# Patient Record
Sex: Female | Born: 1985
Health system: Southern US, Community
[De-identification: ages and names within clinical notes are randomized; demographics above are authoritative.]

## PROBLEM LIST (undated history)

## (undated) DIAGNOSIS — Z8619 Personal history of other infectious and parasitic diseases: Secondary | ICD-10-CM

## (undated) DIAGNOSIS — Z789 Other specified health status: Secondary | ICD-10-CM

## (undated) DIAGNOSIS — F32A Depression, unspecified: Secondary | ICD-10-CM

## (undated) DIAGNOSIS — F419 Anxiety disorder, unspecified: Secondary | ICD-10-CM

## (undated) DIAGNOSIS — B999 Unspecified infectious disease: Secondary | ICD-10-CM

## (undated) DIAGNOSIS — F329 Major depressive disorder, single episode, unspecified: Secondary | ICD-10-CM

## (undated) HISTORY — PX: NO PAST SURGERIES: SHX2092

## (undated) HISTORY — PX: WISDOM TOOTH EXTRACTION: SHX21

## (undated) HISTORY — DX: Personal history of other infectious and parasitic diseases: Z86.19

---
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
## 2010-02-11 ENCOUNTER — Ambulatory Visit (HOSPITAL_COMMUNITY)
Admission: RE | Admit: 2010-02-11 | Discharge: 2010-02-11 | Payer: Self-pay | Source: Home / Self Care | Attending: Obstetrics and Gynecology | Admitting: Obstetrics and Gynecology

## 2010-02-16 ENCOUNTER — Ambulatory Visit (HOSPITAL_COMMUNITY)
Admission: RE | Admit: 2010-02-16 | Discharge: 2010-02-16 | Payer: Self-pay | Source: Home / Self Care | Attending: Obstetrics and Gynecology | Admitting: Obstetrics and Gynecology

## 2010-03-01 ENCOUNTER — Ambulatory Visit (HOSPITAL_COMMUNITY)
Admission: RE | Admit: 2010-03-01 | Discharge: 2010-03-01 | Payer: Self-pay | Source: Home / Self Care | Attending: Obstetrics and Gynecology | Admitting: Obstetrics and Gynecology

## 2010-03-10 ENCOUNTER — Ambulatory Visit (HOSPITAL_COMMUNITY): Admission: RE | Admit: 2010-03-10 | Payer: Self-pay | Source: Home / Self Care | Admitting: Obstetrics and Gynecology

## 2010-03-11 ENCOUNTER — Ambulatory Visit (HOSPITAL_COMMUNITY)
Admission: RE | Admit: 2010-03-11 | Discharge: 2010-03-11 | Payer: Self-pay | Source: Home / Self Care | Attending: Obstetrics and Gynecology | Admitting: Obstetrics and Gynecology

## 2010-03-12 ENCOUNTER — Other Ambulatory Visit (HOSPITAL_COMMUNITY): Payer: Self-pay | Admitting: Obstetrics and Gynecology

## 2010-03-18 ENCOUNTER — Other Ambulatory Visit (HOSPITAL_COMMUNITY): Payer: Self-pay | Admitting: Obstetrics and Gynecology

## 2010-03-18 ENCOUNTER — Ambulatory Visit (HOSPITAL_COMMUNITY)
Admission: RE | Admit: 2010-03-18 | Discharge: 2010-03-18 | Payer: Self-pay | Source: Home / Self Care | Attending: Obstetrics and Gynecology | Admitting: Obstetrics and Gynecology

## 2010-03-18 DIAGNOSIS — R609 Edema, unspecified: Secondary | ICD-10-CM

## 2010-03-21 ENCOUNTER — Ambulatory Visit (HOSPITAL_COMMUNITY)
Admission: RE | Admit: 2010-03-21 | Discharge: 2010-03-21 | Payer: Self-pay | Source: Home / Self Care | Attending: Obstetrics and Gynecology | Admitting: Obstetrics and Gynecology

## 2010-03-25 ENCOUNTER — Other Ambulatory Visit (HOSPITAL_COMMUNITY): Payer: Self-pay | Admitting: Obstetrics and Gynecology

## 2010-03-25 ENCOUNTER — Other Ambulatory Visit (HOSPITAL_COMMUNITY): Payer: Self-pay

## 2010-03-25 ENCOUNTER — Ambulatory Visit (HOSPITAL_COMMUNITY)
Admission: RE | Admit: 2010-03-25 | Discharge: 2010-03-25 | Disposition: A | Discharge status: Home / Self Care | Payer: BC Managed Care – PPO | Source: Ambulatory Visit | Attending: Obstetrics and Gynecology | Admitting: Obstetrics and Gynecology

## 2010-03-25 DIAGNOSIS — O358XX0 Maternal care for other (suspected) fetal abnormality and damage, not applicable or unspecified: Secondary | ICD-10-CM | POA: Insufficient documentation

## 2010-03-25 DIAGNOSIS — O337XX0 Maternal care for disproportion due to other fetal deformities, not applicable or unspecified: Secondary | ICD-10-CM | POA: Insufficient documentation

## 2010-03-25 DIAGNOSIS — Z3689 Encounter for other specified antenatal screening: Secondary | ICD-10-CM | POA: Insufficient documentation

## 2010-03-26 ENCOUNTER — Inpatient Hospital Stay (HOSPITAL_COMMUNITY)
Admission: AD | Admit: 2010-03-26 | Discharge: 2010-03-28 | DRG: 380 | Disposition: A | Discharge status: Home / Self Care | Payer: BC Managed Care – PPO | Source: Ambulatory Visit | Attending: Obstetrics and Gynecology | Admitting: Obstetrics and Gynecology

## 2010-03-26 ENCOUNTER — Encounter (HOSPITAL_COMMUNITY): Payer: Self-pay | Admitting: Obstetrics and Gynecology

## 2010-03-26 DIAGNOSIS — O021 Missed abortion: Principal | ICD-10-CM | POA: Diagnosis present

## 2010-03-27 ENCOUNTER — Other Ambulatory Visit: Payer: Self-pay | Admitting: Obstetrics and Gynecology

## 2010-03-27 LAB — TYPE AND SCREEN
ABO/RH(D): O POS
Antibody Screen: NEGATIVE

## 2010-03-28 LAB — CBC
MCV: 92.1 fL (ref 78.0–100.0)
Platelets: 72 10*3/uL — ABNORMAL LOW (ref 150–400)
RDW: 12.6 % (ref 11.5–15.5)
WBC: 9.3 10*3/uL (ref 4.0–10.5)

## 2010-03-31 ENCOUNTER — Ambulatory Visit (HOSPITAL_COMMUNITY): Payer: Self-pay

## 2010-04-13 NOTE — H&P (Signed)
  NAMETEIGAN, MANNER NO.:  [id number]  MEDICAL RECORD NO.:  [id number]         PATIENT TYPE:  WINP  LOCATION:  372                           FACILITY:  [facility]  PHYSICIAN:  Juluis Mire, M.D.   DATE OF BIRTH:  [birth year]  DATE OF ADMISSION:  03/26/2010 DATE OF DISCHARGE:                             HISTORY & PHYSICAL   The patient is a 25 year old gravida 1, para 0 female, estimated date confinement, September 18, 2009, given her estimated gestational age of [redacted] weeks.  Her prenatal course had been complicated.  Her first trimester ultrasound had revealed the evidence of the cystic hygroma.  She was referred to Maternal Fetal Medicine for followup ultrasound, it did reveal extensive hydropic changes with the cystic hygroma, poor effusions, ascites, and pericardial effusions.  There was obviously much concern about the possibility of genetic issues.  She was seen last week by Maternal Fetal Medicine, ultrasound revealed a nonviable pregnancy at that point in time.  She now presents for Cytotec induction.  In terms of allergies, the patient has no known drug allergies.  Medications include prenatal vitamins.  PAST MEDICAL HISTORY:  She has usual childhood diseases.  Does have a history of migraine headaches.  PAST SURGICAL HISTORY:  She had a wisdom tooth extraction, no other surgery noted.  FAMILY HISTORY:  Noncontributory.  SOCIAL HISTORY:  No tobacco or alcohol use.  REVIEW OF SYSTEMS:  Noncontributory.  PHYSICAL EXAMINATION:  GENERAL/VITAL SIGNS:  The patient is afebrile with stable vital signs. LUNGS:  Clear. CARDIOVASCULAR SYSTEM:  Regular rate without murmurs or gallops. ABDOMEN:  Benign.  Gravid uterus noted below the umbilicus. PELVIC:  Cervix long and closed. EXTREMITIES:  Trace edema. NEUROLOGIC:  Grossly normal limits.  IMPRESSION: 1. Nonviable pregnancy at 18 weeks. 2. Previous findings of extensive fetal anomalies probably secondary  to genetic issues.  PLAN:  The patient will undergo Cytotec induction.  The nature of procedure and risks have been explained.  Her blood type is O positive. RhoGAM will not be required.  There will be the potential need for D and C.     Juluis Mire, M.D.     JSM/MEDQ  D:  03/27/2010  T:  03/27/2010  Job:  [id number]  Electronically Signed by Richardean Chimera M.D. on 04/13/2010 07:46:08 AM

## 2010-04-13 NOTE — Discharge Summary (Signed)
  Janet Martinez, Janet Martinez               ACCOUNT NO.:  [id number]  MEDICAL RECORD NO.:  [id number]           PATIENT TYPE:  LOCATION:                                 FACILITY:  PHYSICIAN:  Juluis Mire, M.D.   DATE OF BIRTH:  1985/12/07  DATE OF ADMISSION:  03/26/2010 DATE OF DISCHARGE:                              DISCHARGE SUMMARY   ADMITTING DIAGNOSES:  Intrauterine pregnancy at 18 weeks with intrauterine fetal demise.  Evidence of significant fetal anomalies with cystic hygroma, probably genetic disorders.  DISCHARGE DIAGNOSES:  Intrauterine pregnancy at 18 weeks with intrauterine fetal demise.  Evidence of significant fetal anomalies with cystic hygroma, probably genetic disorders.  OPERATIVE PROCEDURE:  Cytotec induced delivery.  For complete history and physical please see dictated note.  COURSE IN THE HOSPITAL:  The patient is begun on Cytotec.  Friday afternoon delivered the fetus intact.  There is gross anomalies with the fetal head.  It was a female infant, will be sent for further evaluation. Placenta was delivered intact.  Tissue is sent for genetics.  She was doing well.  We decided to discharge her home on the Sunday afternoon. She will be discharged home in stable condition.  In terms of complications, none were encountered in the hospital, the patient is discharged home in stable condition.  DISPOSITION:  The patient to avoid vaginal entrance.  Will call with heavy bleeding, fever, excessive pain, nausea, vomiting.  Discharged home on Tylox as needed for pain and a short course of ciprofloxacin. Office will call her tomorrow and arrange followup.     Juluis Mire, M.D.     JSM/MEDQ  D:  03/27/2010  T:  03/28/2010  Job:  [id number]  Electronically Signed by Richardean Chimera M.D. on 04/13/2010 07:46:06 AM

## 2010-04-21 DEATH — deceased

## 2010-05-18 ENCOUNTER — Ambulatory Visit (HOSPITAL_COMMUNITY): Admission: RE | Admit: 2010-05-18 | Payer: BC Managed Care – PPO | Source: Ambulatory Visit

## 2010-05-18 ENCOUNTER — Ambulatory Visit (HOSPITAL_COMMUNITY)
Admission: RE | Admit: 2010-05-18 | Discharge: 2010-05-18 | Disposition: A | Discharge status: Home / Self Care | Payer: BC Managed Care – PPO | Source: Ambulatory Visit | Attending: Obstetrics and Gynecology | Admitting: Obstetrics and Gynecology

## 2010-07-15 LAB — RPR
RPR: NONREACTIVE
RPR: NONREACTIVE

## 2010-07-15 LAB — GC/CHLAMYDIA PROBE AMP, GENITAL
Chlamydia: NEGATIVE
Chlamydia: NEGATIVE
Gonorrhea: NEGATIVE

## 2010-07-15 LAB — HIV ANTIBODY (ROUTINE TESTING W REFLEX): HIV: NONREACTIVE

## 2011-02-07 ENCOUNTER — Telehealth (HOSPITAL_COMMUNITY): Payer: Self-pay | Admitting: *Deleted

## 2011-02-07 ENCOUNTER — Encounter (HOSPITAL_COMMUNITY): Payer: Self-pay | Admitting: *Deleted

## 2011-02-07 NOTE — Telephone Encounter (Signed)
Preadmission screen  

## 2011-02-08 ENCOUNTER — Inpatient Hospital Stay (HOSPITAL_COMMUNITY)
Admission: RE | Admit: 2011-02-08 | Discharge: 2011-02-10 | DRG: 373 | Disposition: A | Discharge status: Home / Self Care | Payer: BC Managed Care – PPO | Source: Ambulatory Visit | Attending: Obstetrics and Gynecology | Admitting: Obstetrics and Gynecology

## 2011-02-08 ENCOUNTER — Inpatient Hospital Stay (HOSPITAL_COMMUNITY): Payer: BC Managed Care – PPO | Admitting: Anesthesiology

## 2011-02-08 ENCOUNTER — Encounter (HOSPITAL_COMMUNITY): Payer: Self-pay | Admitting: Anesthesiology

## 2011-02-08 ENCOUNTER — Encounter (HOSPITAL_COMMUNITY): Payer: Self-pay

## 2011-02-08 LAB — CBC
HCT: 41.2 % (ref 36.0–46.0)
Hemoglobin: 14.3 g/dL (ref 12.0–15.0)
WBC: 14.6 10*3/uL — ABNORMAL HIGH (ref 4.0–10.5)

## 2011-02-08 MED ORDER — LIDOCAINE HCL (PF) 1 % IJ SOLN: 30.0000 mL | INTRAMUSCULAR | Status: DC | PRN

## 2011-02-08 MED ORDER — LACTATED RINGERS IV SOLN
INTRAVENOUS | Status: DC
  Administered 2011-02-08 (×2): via INTRAVENOUS

## 2011-02-08 MED ORDER — LACTATED RINGERS IV SOLN
500.0000 mL | Freq: Once | INTRAVENOUS | Status: AC
  Administered 2011-02-08: 1000 mL via INTRAVENOUS

## 2011-02-08 MED ORDER — LIDOCAINE HCL 1.5 % IJ SOLN
INTRAMUSCULAR | Status: DC | PRN
  Administered 2011-02-08 (×2): 5 mL via INTRADERMAL
  Administered 2011-02-08: 2 mL via INTRADERMAL

## 2011-02-08 MED ORDER — MEDROXYPROGESTERONE ACETATE 150 MG/ML IM SUSP: 150.0000 mg | INTRAMUSCULAR | Status: DC | PRN

## 2011-02-08 MED ORDER — OXYTOCIN 20 UNITS IN LACTATED RINGERS INFUSION - SIMPLE: 125.0000 mL/h | Freq: Once | INTRAVENOUS | Status: AC

## 2011-02-08 MED ORDER — METHYLERGONOVINE MALEATE 0.2 MG PO TABS: 0.2000 mg | ORAL_TABLET | ORAL | Status: DC | PRN

## 2011-02-08 MED ORDER — PHENYLEPHRINE 40 MCG/ML (10ML) SYRINGE FOR IV PUSH (FOR BLOOD PRESSURE SUPPORT)
80.0000 ug | PREFILLED_SYRINGE | INTRAVENOUS | Status: DC | PRN
  Filled 2011-02-08: qty 5

## 2011-02-08 MED ORDER — ONDANSETRON HCL 4 MG PO TABS: 4.0000 mg | ORAL_TABLET | ORAL | Status: DC | PRN

## 2011-02-08 MED ORDER — SIMETHICONE 80 MG PO CHEW: 80.0000 mg | CHEWABLE_TABLET | ORAL | Status: DC | PRN

## 2011-02-08 MED ORDER — EPHEDRINE 5 MG/ML INJ
10.0000 mg | INTRAVENOUS | Status: AC | PRN
  Administered 2011-02-08 (×2): 10 mg via INTRAVENOUS
  Filled 2011-02-08: qty 4

## 2011-02-08 MED ORDER — PHENYLEPHRINE 40 MCG/ML (10ML) SYRINGE FOR IV PUSH (FOR BLOOD PRESSURE SUPPORT): 80.0000 ug | PREFILLED_SYRINGE | INTRAVENOUS | Status: DC | PRN

## 2011-02-08 MED ORDER — ONDANSETRON HCL 4 MG/2ML IJ SOLN: 4.0000 mg | INTRAMUSCULAR | Status: DC | PRN

## 2011-02-08 MED ORDER — ONDANSETRON HCL 4 MG/2ML IJ SOLN: 4.0000 mg | Freq: Four times a day (QID) | INTRAMUSCULAR | Status: DC | PRN

## 2011-02-08 MED ORDER — IBUPROFEN 600 MG PO TABS
600.0000 mg | ORAL_TABLET | Freq: Four times a day (QID) | ORAL | Status: DC
  Administered 2011-02-08 – 2011-02-10 (×6): 600 mg via ORAL
  Filled 2011-02-08 (×6): qty 1

## 2011-02-08 MED ORDER — METHYLERGONOVINE MALEATE 0.2 MG/ML IJ SOLN: 0.2000 mg | INTRAMUSCULAR | Status: DC | PRN

## 2011-02-08 MED ORDER — TETANUS-DIPHTH-ACELL PERTUSSIS 5-2.5-18.5 LF-MCG/0.5 IM SUSP: 0.5000 mL | Freq: Once | INTRAMUSCULAR | Status: DC

## 2011-02-08 MED ORDER — FENTANYL 2.5 MCG/ML BUPIVACAINE 1/10 % EPIDURAL INFUSION (WH - ANES)
14.0000 mL/h | INTRAMUSCULAR | Status: DC
  Administered 2011-02-08 (×2): 14 mL/h via EPIDURAL
  Filled 2011-02-08 (×2): qty 60

## 2011-02-08 MED ORDER — OXYTOCIN BOLUS FROM INFUSION
500.0000 mL | Freq: Once | INTRAVENOUS | Status: AC
  Administered 2011-02-08: 500 mL via INTRAVENOUS
  Filled 2011-02-08: qty 500

## 2011-02-08 MED ORDER — PRENATAL MULTIVITAMIN CH
1.0000 | ORAL_TABLET | Freq: Every day | ORAL | Status: DC
  Administered 2011-02-09: 1 via ORAL
  Filled 2011-02-08: qty 1

## 2011-02-08 MED ORDER — MEASLES, MUMPS & RUBELLA VAC ~~LOC~~ INJ: 0.5000 mL | INJECTION | Freq: Once | SUBCUTANEOUS | Status: DC

## 2011-02-08 MED ORDER — WITCH HAZEL-GLYCERIN EX PADS
1.0000 "application " | MEDICATED_PAD | CUTANEOUS | Status: DC | PRN
  Administered 2011-02-08: 1 via TOPICAL

## 2011-02-08 MED ORDER — DIBUCAINE 1 % RE OINT
1.0000 "application " | TOPICAL_OINTMENT | RECTAL | Status: DC | PRN
  Filled 2011-02-08: qty 28

## 2011-02-08 MED ORDER — OXYTOCIN 20 UNITS IN LACTATED RINGERS INFUSION - SIMPLE
1.0000 m[IU]/min | INTRAVENOUS | Status: DC
  Administered 2011-02-08: 2 m[IU]/min via INTRAVENOUS
  Filled 2011-02-08: qty 1000

## 2011-02-08 MED ORDER — SENNOSIDES-DOCUSATE SODIUM 8.6-50 MG PO TABS
2.0000 | ORAL_TABLET | Freq: Every day | ORAL | Status: DC
  Administered 2011-02-09: 2 via ORAL

## 2011-02-08 MED ORDER — ACETAMINOPHEN 325 MG PO TABS: 650.0000 mg | ORAL_TABLET | ORAL | Status: DC | PRN

## 2011-02-08 MED ORDER — CITRIC ACID-SODIUM CITRATE 334-500 MG/5ML PO SOLN: 30.0000 mL | ORAL | Status: DC | PRN

## 2011-02-08 MED ORDER — IBUPROFEN 600 MG PO TABS: 600.0000 mg | ORAL_TABLET | Freq: Four times a day (QID) | ORAL | Status: DC | PRN

## 2011-02-08 MED ORDER — EPHEDRINE 5 MG/ML INJ
10.0000 mg | INTRAVENOUS | Status: DC | PRN
  Filled 2011-02-08: qty 4

## 2011-02-08 MED ORDER — TERBUTALINE SULFATE 1 MG/ML IJ SOLN: 0.2500 mg | Freq: Once | INTRAMUSCULAR | Status: DC | PRN

## 2011-02-08 MED ORDER — FLEET ENEMA 7-19 GM/118ML RE ENEM: 1.0000 | ENEMA | RECTAL | Status: DC | PRN

## 2011-02-08 MED ORDER — BENZOCAINE-MENTHOL 20-0.5 % EX AERO: 1.0000 "application " | INHALATION_SPRAY | CUTANEOUS | Status: DC | PRN

## 2011-02-08 MED ORDER — LACTATED RINGERS IV SOLN: 500.0000 mL | INTRAVENOUS | Status: DC | PRN

## 2011-02-08 MED ORDER — DIPHENHYDRAMINE HCL 50 MG/ML IJ SOLN: 12.5000 mg | INTRAMUSCULAR | Status: DC | PRN

## 2011-02-08 MED ORDER — OXYCODONE-ACETAMINOPHEN 5-325 MG PO TABS: 2.0000 | ORAL_TABLET | ORAL | Status: DC | PRN

## 2011-02-08 MED ORDER — OXYCODONE-ACETAMINOPHEN 5-325 MG PO TABS
1.0000 | ORAL_TABLET | ORAL | Status: DC | PRN
  Administered 2011-02-09 – 2011-02-10 (×3): 1 via ORAL
  Filled 2011-02-08 (×3): qty 1

## 2011-02-08 MED ORDER — DIPHENHYDRAMINE HCL 25 MG PO CAPS: 25.0000 mg | ORAL_CAPSULE | Freq: Four times a day (QID) | ORAL | Status: DC | PRN

## 2011-02-08 MED ORDER — FAMOTIDINE 20 MG PO TABS: 20.0000 mg | ORAL_TABLET | Freq: Every day | ORAL | Status: DC

## 2011-02-08 MED ORDER — LANOLIN HYDROUS EX OINT: TOPICAL_OINTMENT | CUTANEOUS | Status: DC | PRN

## 2011-02-08 NOTE — Progress Notes (Signed)
Pt comfortable w/ epidural.   FHT reassuring Toco - Q2-4 Cvx 4/90/0  A/P:  Continue w/ IOL.  Exp mngt.

## 2011-02-08 NOTE — H&P (Signed)
25yo G1P0 @ 39+2 wks scheduled for IOL by my partner Dr Rana Snare because of favorable cervix.  Pt with uncomplicated pregnancy.  H/o cystic hygroma w/ previous pregnancy and SVD at 18wks.  Denies painful ctx, vb or lof.  + FM  Past history - See hollister, GBS neg  AF, VSS + FHT, reassuring Toco occasional Gen - NAD Abd - gravid, NT Ext - NT, no edema Cvx 3/70/-2 AROM - clear  A./P:  Admit, Pitocin IOL Epidural prn

## 2011-02-08 NOTE — Anesthesia Preprocedure Evaluation (Signed)
Anesthesia Evaluation  Patient identified by MRN, date of birth, ID band Patient awake    Reviewed: Allergy & Precautions, H&P , NPO status , Patient's Chart, lab work & pertinent test results, reviewed documented beta blocker date and time   History of Anesthesia Complications Negative for: history of anesthetic complications  Airway Mallampati: II TM Distance: >3 FB Neck ROM: full    Dental  (+) Teeth Intact   Pulmonary neg pulmonary ROS,  clear to auscultation        Cardiovascular neg cardio ROS regular Normal    Neuro/Psych Negative Neurological ROS  Negative Psych ROS   GI/Hepatic negative GI ROS, Neg liver ROS,   Endo/Other  Negative Endocrine ROS  Renal/GU negative Renal ROS     Musculoskeletal   Abdominal   Peds  Hematology negative hematology ROS (+)   Anesthesia Other Findings   Reproductive/Obstetrics (+) Pregnancy                           Anesthesia Physical Anesthesia Plan  ASA: II  Anesthesia Plan: Epidural   Post-op Pain Management:    Induction:   Airway Management Planned:   Additional Equipment:   Intra-op Plan:   Post-operative Plan:   Informed Consent: I have reviewed the patients History and Physical, chart, labs and discussed the procedure including the risks, benefits and alternatives for the proposed anesthesia with the patient or authorized representative who has indicated his/her understanding and acceptance.     Plan Discussed with:   Anesthesia Plan Comments:         Anesthesia Quick Evaluation  

## 2011-02-08 NOTE — Anesthesia Procedure Notes (Signed)
Epidural Patient location during procedure: OB Start time: 02/08/2011 12:37 PM Reason for block: procedure for pain  Staffing Performed by: anesthesiologist   Preanesthetic Checklist Completed: patient identified, site marked, surgical consent, pre-op evaluation, timeout performed, IV checked, risks and benefits discussed and monitors and equipment checked  Epidural Patient position: sitting Prep: site prepped and draped and DuraPrep Patient monitoring: continuous pulse ox and blood pressure Approach: midline Injection technique: LOR air  Needle:  Needle type: Tuohy  Needle gauge: 17 G Needle length: 9 cm Needle insertion depth: 4 cm Catheter type: closed end flexible Catheter size: 19 Gauge Catheter at skin depth: 9 cm Test dose: negative  Assessment Events: blood not aspirated, injection not painful, no injection resistance, negative IV test and no paresthesia  Additional Notes Discussed risk of headache, infection, bleeding, nerve injury and failed or incomplete block.  Patient voices understanding and wishes to proceed.

## 2011-02-08 NOTE — Progress Notes (Signed)
SVD of vigerous female infant w/ apgars of 9,9.  Placenta delivered spontaneous w/ 3VC.   2nd degree lac repaired w/ 3-0 vicryl rapide.  Fundus firm.  EBL 500cc .   

## 2011-02-08 NOTE — Anesthesia Postprocedure Evaluation (Signed)
Anesthesia Post Note  Patient: Janet Martinez  Procedure(s) Performed: * No procedures listed *  Anesthesia type: Epidural  Patient location: Mother/Baby  Post pain: Pain level controlled  Post assessment: Post-op Vital signs reviewed  Last Vitals:  Filed Vitals:   02/08/11 2201  BP: 106/68  Pulse: 81  Temp:   Resp: 18    Post vital signs: Reviewed  Level of consciousness: awake  Complications: No apparent anesthesia complications

## 2011-02-08 NOTE — Progress Notes (Signed)
Pt feeling vaginal pressure.    FHT reassuring toco Q2-3 Cvx c/c/+1  A/P:  Will start pushing

## 2011-02-09 LAB — CBC
HCT: 33.5 % — ABNORMAL LOW (ref 36.0–46.0)
MCHC: 34.3 g/dL (ref 30.0–36.0)
Platelets: 171 10*3/uL (ref 150–400)
RDW: 13 % (ref 11.5–15.5)
WBC: 21.6 10*3/uL — ABNORMAL HIGH (ref 4.0–10.5)

## 2011-02-09 MED ORDER — BENZOCAINE-MENTHOL 20-0.5 % EX AERO
INHALATION_SPRAY | CUTANEOUS | Status: AC
  Administered 2011-02-09: 01:00:00
  Filled 2011-02-09: qty 56

## 2011-02-09 NOTE — Progress Notes (Signed)
Post Partum Day 1 Subjective: up ad lib, tolerating PO and states some difficulty with voiding. Denies dysuria, admits to decrease sensation  Objective: Blood pressure 91/58, pulse 72, temperature 97.4 F (36.3 C), temperature source Oral, resp. rate 18, height 5\' 2"  (1.575 m), weight 61.236 kg (135 lb), last menstrual period 05/09/2010, SpO2 100.00%, unknown if currently breastfeeding.  Physical Exam:  General: alert and cooperative Lochia: appropriate Uterine Fundus: firm Perineum intact, no significant edema noted DVT Evaluation: No evidence of DVT seen on physical exam.   Basename 02/09/11 0505 02/08/11 0735  HGB 11.5* 14.3  HCT 33.5* 41.2    Assessment/Plan: Plan for discharge tomorrow   LOS: 1 day   Janet Martinez 02/09/2011, 7:55 AM

## 2011-02-09 NOTE — Addendum Note (Signed)
Addendum  created 02/09/11 9147 by Marrion Coy   Modules edited:Charges VN

## 2011-02-10 MED ORDER — OXYCODONE-ACETAMINOPHEN 5-325 MG PO TABS: 1.0000 | ORAL_TABLET | ORAL | Status: AC | PRN

## 2011-02-10 MED ORDER — WITCH HAZEL-GLYCERIN EX PADS: 1.0000 "application " | MEDICATED_PAD | CUTANEOUS | Status: AC | PRN

## 2011-02-10 MED ORDER — DIBUCAINE 1 % RE OINT: 1.0000 "application " | TOPICAL_OINTMENT | RECTAL | Status: DC | PRN

## 2011-02-10 MED ORDER — IBUPROFEN 600 MG PO TABS: 600.0000 mg | ORAL_TABLET | Freq: Four times a day (QID) | ORAL | Status: AC

## 2011-02-10 NOTE — Discharge Summary (Signed)
Obstetric Discharge Summary Reason for Admission: induction of labor Prenatal Procedures: ultrasound Intrapartum Procedures: spontaneous vaginal delivery Postpartum Procedures: none Complications-Operative and Postpartum: 2 degree perineal laceration Hemoglobin  Date Value Range Status  02/09/2011 11.5* 12.0-15.0 (g/dL) Final     DELTA CHECK NOTED     REPEATED TO VERIFY     HCT  Date Value Range Status  02/09/2011 33.5* 36.0-46.0 (%) Final    Discharge Diagnoses: Term Pregnancy-delivered  Discharge Information: Date: 02/10/2011 Activity: pelvic rest Diet: routine Medications: None, Ibuprofen, Percocet and tucks and nupercainal ointment Condition: stable Instructions: refer to practice specific booklet Discharge to: home   Newborn Data: Live born female  Birth Weight: 6 lb 6.7 oz (2912 g) APGAR: 9, 9  Home with mother.  Wally Shevchenko G 02/10/2011, 8:02 AM

## 2011-02-10 NOTE — Progress Notes (Signed)
Post Partum Day 2 Subjective: up ad lib, voiding, tolerating PO and complaining of hemorrhoidal pain   Objective: Blood pressure 84/56, pulse 75, temperature 98.2 F (36.8 C), temperature source Oral, resp. rate 18, height 5\' 2"  (1.575 m), weight 61.236 kg (135 lb), last menstrual period 05/09/2010, SpO2 100.00%, unknown if currently breastfeeding.  Physical Exam:  General: alert and cooperative Lochia: appropriate Uterine Fundus: firm Incision: healing well, no significant hemorrhoid noted DVT Evaluation: No evidence of DVT seen on physical exam.   Basename 02/09/11 0505 02/08/11 0735  HGB 11.5* 14.3  HCT 33.5* 41.2    Assessment/Plan: Discharge home   LOS: 2 days   Janet Martinez 02/10/2011, 7:52 AM

## 2011-02-13 ENCOUNTER — Inpatient Hospital Stay (HOSPITAL_COMMUNITY): Admission: AD | Admit: 2011-02-13 | Payer: Self-pay | Source: Ambulatory Visit | Admitting: Obstetrics and Gynecology

## 2011-09-28 ENCOUNTER — Ambulatory Visit (INDEPENDENT_AMBULATORY_CARE_PROVIDER_SITE_OTHER): Payer: 59 | Admitting: Internal Medicine

## 2011-09-28 ENCOUNTER — Encounter: Payer: Self-pay | Admitting: Internal Medicine

## 2011-09-28 VITALS — BP 100/68 | HR 99 | Temp 98.6°F | Ht 62.0 in | Wt 121.0 lb

## 2011-09-28 DIAGNOSIS — J209 Acute bronchitis, unspecified: Secondary | ICD-10-CM

## 2011-09-28 MED ORDER — AZITHROMYCIN 250 MG PO TABS: ORAL_TABLET | ORAL | Status: AC

## 2011-09-28 MED ORDER — GUAIFENESIN-CODEINE 100-10 MG/5ML PO SYRP: 5.0000 mL | ORAL_SOLUTION | Freq: Four times a day (QID) | ORAL | Status: AC | PRN

## 2011-09-28 NOTE — Progress Notes (Signed)
  Subjective:    Patient ID: Janet Martinez, female    DOB: [birth year], 26 y.o.   MRN: [id number]  Cough This is a new problem. The current episode started in the past 7 days. The problem has been waxing and waning. The problem occurs every few minutes. The cough is productive of sputum. Associated symptoms include myalgias. Pertinent negatives include no ear congestion, ear pain, headaches, heartburn, nasal congestion, postnasal drip, rash, rhinorrhea, sore throat or shortness of breath. The symptoms are aggravated by lying down. She has tried OTC cough suppressant for the symptoms. The treatment provided no relief. There is no history of asthma, bronchitis, COPD, environmental allergies or pneumonia.    History reviewed. No pertinent past medical history.   Review of Systems  HENT: Negative for ear pain, sore throat, rhinorrhea and postnasal drip.   Respiratory: Positive for cough. Negative for shortness of breath.   Gastrointestinal: Negative for heartburn.  Musculoskeletal: Positive for myalgias.  Skin: Negative for rash.  Neurological: Negative for headaches.  Hematological: Negative for environmental allergies.       Objective:   Physical Exam BP 100/68  Pulse 99  Temp 98.6 F (37 C) (Oral)  Ht 5\' 2"  (1.575 m)  Wt 121 lb (54.885 kg)  BMI 22.13 kg/m2  SpO2 95% Wt Readings from Last 3 Encounters:  09/28/11 121 lb (54.885 kg)   Constitutional: She appears well-developed and well-nourished. Coughing and mildy ill bu no acute distress. 2mo dtr at side HENT: Head: Normocephalic and atraumatic. Sinus non tender Ears: B TMs ok, no erythema or effusion; Nose: Nose normal. Mouth/Throat: Oropharynx is mildly red but clear and moist. No oropharyngeal exudate.  Eyes: Conjunctivae and EOM are normal. Pupils are equal, round, and reactive to light. No scleral icterus.  Neck: Normal range of motion. Neck supple. No JVD present. Mild LAD anterior. No thyromegaly present.  Cardiovascular:  Normal rate, regular rhythm and normal heart sounds.  No murmur heard. No BLE edema. Pulmonary/Chest: Effort normal and breath sounds with scattered rhonchi. No respiratory distress. She has no wheezes.  Skin: Skin is warm and dry. No rash noted. No erythema.  Psychiatric: She has a normal mood and affect. Her behavior is normal. Judgment and thought content normal.   No results found for this basename: WBC, HGB, HCT, PLT, GLUCOSE, CHOL, TRIG, HDL, LDLDIRECT, LDLCALC, ALT, AST, NA, K, CL, CREATININE, BUN, CO2, TSH, PSA, INR, GLUF, HGBA1C, MICROALBUR       Assessment & Plan:  Acute bronchitis  - Zpak and symptomatic control

## 2011-09-28 NOTE — Patient Instructions (Addendum)
It was good to see you today. We have reviewed your prior records including labs and tests today Zpak antibiotics and codeine cough syrup - Your prescription(s) have been submitted to your pharmacy. Please take as directed and contact our office if you believe you are having problem(s) with the medication(s). Alternate between ibuprofen and tylenol for aches, pain and fever symptoms as discussed Hydrate, rest and call if worse or unimproved Acute Bronchitis You have acute bronchitis. This means you have a chest cold. The airways in your lungs are red and sore (inflamed). Acute means it is sudden onset.   CAUSES Bronchitis is most often caused by the same virus that causes a cold. SYMPTOMS    Body aches.   Chest congestion.   Chills.   Cough.   Fever.   Shortness of breath.   Sore throat.  TREATMENT   Acute bronchitis is usually treated with rest, fluids, and medicines for relief of fever or cough. Most symptoms should go away after a few days or a week. Increased fluids may help thin your secretions and will prevent dehydration. Your caregiver may give you an inhaler to improve your symptoms. The inhaler reduces shortness of breath and helps control cough. You can take over-the-counter pain relievers or cough medicine to decrease coughing, pain, or fever. A cool-air vaporizer may help thin bronchial secretions and make it easier to clear your chest. Antibiotics are usually not needed but can be prescribed if you smoke, are seriously ill, have chronic lung problems, are elderly, or you are at higher risk for developing complications. Allergies and asthma can make bronchitis worse. Repeated episodes of bronchitis may cause longstanding lung problems. Avoid smoking and secondhand smoke. Exposure to cigarette smoke or irritating chemicals will make bronchitis worse. If you are a cigarette smoker, consider using nicotine gum or skin patches to help control withdrawal symptoms. Quitting smoking  will help your lungs heal faster. Recovery from bronchitis is often slow, but you should start feeling better after 2 to 3 days. Cough from bronchitis frequently lasts for 3 to 4 weeks. To prevent another bout of acute bronchitis:  Quit smoking.   Wash your hands frequently to get rid of viruses or use a hand sanitizer.   Avoid other people with cold or virus symptoms.   Try not to touch your hands to your mouth, nose, or eyes.  SEEK IMMEDIATE MEDICAL CARE IF:  You develop increased fever, chills, or chest pain.   You have severe shortness of breath or bloody sputum.   You develop dehydration, fainting, repeated vomiting, or a severe headache.   You have no improvement after 1 week of treatment or you get worse.  MAKE SURE YOU:    Understand these instructions.   Will watch your condition.   Will get help right away if you are not doing well or get worse.  Document Released: 03/16/2004 Document Revised: 01/26/2011 Document Reviewed: 06/01/2010 [facility] Patient Information 2012 [place], [place].

## 2011-09-29 ENCOUNTER — Encounter (HOSPITAL_COMMUNITY): Payer: Self-pay

## 2013-01-24 ENCOUNTER — Ambulatory Visit (INDEPENDENT_AMBULATORY_CARE_PROVIDER_SITE_OTHER): Payer: 59 | Admitting: Internal Medicine

## 2013-01-24 ENCOUNTER — Encounter: Payer: Self-pay | Admitting: Internal Medicine

## 2013-01-24 VITALS — BP 102/62 | HR 80 | Temp 98.8°F | Wt 117.0 lb

## 2013-01-24 DIAGNOSIS — M5412 Radiculopathy, cervical region: Secondary | ICD-10-CM

## 2013-01-24 DIAGNOSIS — M542 Cervicalgia: Secondary | ICD-10-CM

## 2013-01-24 DIAGNOSIS — M5010 Cervical disc disorder with radiculopathy, unspecified cervical region: Secondary | ICD-10-CM

## 2013-01-24 MED ORDER — HYDROCODONE-ACETAMINOPHEN 5-325 MG PO TABS: 1.0000 | ORAL_TABLET | Freq: Four times a day (QID) | ORAL | Status: DC | PRN

## 2013-01-24 MED ORDER — PREDNISONE (PAK) 10 MG PO TABS: ORAL_TABLET | ORAL | Status: DC

## 2013-01-24 MED ORDER — CYCLOBENZAPRINE HCL 5 MG PO TABS: 5.0000 mg | ORAL_TABLET | Freq: Three times a day (TID) | ORAL | Status: DC | PRN

## 2013-01-24 NOTE — Progress Notes (Signed)
   Subjective:    Patient ID: Janet Martinez, female    DOB: 12/20/1985, 27 y.o.   MRN: [id number]  HPI Comments: Currently in third episode of neck pain with radiation to right arm since August 2014. Current episode began yesterday. Prior to episodes of evaluate and treated in [facility] by urgent care with prednisone taper, muscle relaxants and hydrocodone. First flare with good response to therapy. Secondly are slower to respond and lasted 2 weeks prior to resolution. Currently improving with ibuprofen but concerned for recurrence and persistence given history of recent events.  Neck Pain  This is a recurrent problem. The current episode started yesterday. The problem occurs constantly. The problem has been waxing and waning. The pain is associated with a sleep position. The pain is present in the right side. The quality of the pain is described as aching, stabbing and burning. The pain is moderate. The symptoms are aggravated by position. The pain is same all the time. Associated symptoms include numbness (R hand) and tingling (R arm/hand). Pertinent negatives include no chest pain, fever, headaches, leg pain, pain with swallowing, photophobia, syncope, trouble swallowing, visual change, weakness or weight loss. She has tried NSAIDs, muscle relaxants and oral narcotics for the symptoms. The treatment provided moderate relief.    Past Medical History  Diagnosis Date  . History of chicken pox     Review of Systems  Constitutional: Negative for fever and weight loss.  HENT: Negative for trouble swallowing.   Eyes: Negative for photophobia.  Cardiovascular: Negative for chest pain and syncope.  Musculoskeletal: Positive for neck pain.  Neurological: Positive for tingling (R arm/hand) and numbness (R hand). Negative for weakness and headaches.       Objective:   Physical Exam  BP 102/62  Pulse 80  Temp(Src) 98.8 F (37.1 C) (Oral)  Wt 117 lb (53.071 kg)  SpO2 99% Wt Readings from Last  3 Encounters:  01/24/13 117 lb (53.071 kg)  09/28/11 121 lb (54.885 kg)  02/08/11 135 lb (61.236 kg)   Constitutional: She appears well-developed and well-nourished. No distress.  Neck: Normal range of motion. Neck supple, considerable myofascial spasm right greater than left trapezius No JVD present. No thyromegaly present.  Cardiovascular: Normal rate, regular rhythm and normal heart sounds.  No murmur heard. No BLE edema. Pulmonary/Chest: Effort normal and breath sounds normal. No respiratory distress. She has no wheezes.  Neurological: She is alert and oriented to person, place, and time. No cranial nerve deficit. Coordination, balance, strength, speech and gait are normal.  Skin: Skin is warm and dry. No rash noted. No erythema.  Psychiatric: She has a normal mood and affect. Her behavior is normal. Judgment and thought content normal.      Assessment & Plan:   Cervicalgia - recurrent episode x 3 events since 09/2012 with radiation into RLE.  Current flare or less severe than prior two, but persisting radiculopathy with numbness and sensation of weakness in hands. No motor deficit appreciable today  Treat as before with good relief using prednisone taper, muscle relaxant and hydrocodone if severe - rx done  Check MRI C-spine to evaluate for bulging discs or other cervical pathology  Consider referral to physical therapy and/or spine specialist depending on MRI results and response to therapy

## 2013-01-24 NOTE — Patient Instructions (Addendum)
It was good to see you today.  We have reviewed your prior records including labs and tests today  we'll make referral for MRI of neck. Our office will contact you regarding appointment(s) once made. We will then contact you with the results once reviewed  Use Flexeril muscle relaxer every 8 hours as needed for spasm and pain  Also prednisone taper and hydrocodone if needed as reviewed  Your prescription(s) have been submitted to your pharmacy (hydrocodone paper prescription given to you). Please take as directed and contact our office if you believe you are having problem(s) with the medication(s).   Further treatment will depend on the MRI results and your symptoms  Cervical Radiculopathy Cervical radiculopathy happens when a nerve in the neck is pinched or bruised by a slipped (herniated) disk or by arthritic changes in the bones of the cervical spine. This can occur due to an injury or as part of the normal aging process. Pressure on the cervical nerves can cause pain or numbness that runs from your neck all the way down into your arm and fingers. CAUSES  There are many possible causes, including:  Injury.  Muscle tightness in the neck from overuse.  Swollen, painful joints (arthritis).  Breakdown or degeneration in the bones and joints of the spine (spondylosis) due to aging.  Bone spurs that may develop near the cervical nerves. SYMPTOMS  Symptoms include pain, weakness, or numbness in the affected arm and hand. Pain can be severe or irritating. Symptoms may be worse when extending or turning the neck. DIAGNOSIS  Your caregiver will ask about your symptoms and do a physical exam. He or she may test your strength and reflexes. X-rays, CT scans, and MRI scans may be needed in cases of injury or if the symptoms do not go away after a period of time. Electromyography (EMG) or nerve conduction testing may be done to study how your nerves and muscles are working. TREATMENT  Your  caregiver may recommend certain exercises to help relieve your symptoms. Cervical radiculopathy can, and often does, get better with time and treatment. If your problems continue, treatment options may include:  Wearing a soft collar for short periods of time.  Physical therapy to strengthen the neck muscles.  Medicines, such as nonsteroidal anti-inflammatory drugs (NSAIDs), oral corticosteroids, or spinal injections.  Surgery. Different types of surgery may be done depending on the cause of your problems. HOME CARE INSTRUCTIONS   Put ice on the affected area.  Put ice in a plastic bag.  Place a towel between your skin and the bag.  Leave the ice on for 15-20 minutes, 03-04 times a day or as directed by your caregiver.  If ice does not help, you can try using heat. Take a warm shower or bath, or use a hot water bottle as directed by your caregiver.  You may try a gentle neck and shoulder massage.  Use a flat pillow when you sleep.  Only take over-the-counter or prescription medicines for pain, discomfort, or fever as directed by your caregiver.  If physical therapy was prescribed, follow your caregiver's directions.  If a soft collar was prescribed, use it as directed. SEEK IMMEDIATE MEDICAL CARE IF:   Your pain gets much worse and cannot be controlled with medicines.  You have weakness or numbness in your hand, arm, face, or leg.  You have a high fever or a stiff, rigid neck.  You lose bowel or bladder control (incontinence).  You have trouble with walking,  balance, or speaking. MAKE SURE YOU:   Understand these instructions.  Will watch your condition.  Will get help right away if you are not doing well or get worse. Document Released: 11/01/2000 Document Revised: 05/01/2011 Document Reviewed: 09/20/2010 [facility] Patient Information 2014 [place], [place].

## 2013-01-24 NOTE — Progress Notes (Signed)
Pre-visit discussion using our clinic review tool. No additional management support is needed unless otherwise documented below in the visit note.  

## 2013-02-04 ENCOUNTER — Other Ambulatory Visit: Payer: 59

## 2013-02-25 ENCOUNTER — Ambulatory Visit
Admission: RE | Admit: 2013-02-25 | Discharge: 2013-02-25 | Disposition: A | Discharge status: Home / Self Care | Payer: 59 | Source: Ambulatory Visit | Attending: Internal Medicine | Admitting: Internal Medicine

## 2013-02-25 DIAGNOSIS — M5010 Cervical disc disorder with radiculopathy, unspecified cervical region: Secondary | ICD-10-CM

## 2013-02-25 DIAGNOSIS — M542 Cervicalgia: Secondary | ICD-10-CM

## 2013-03-17 ENCOUNTER — Ambulatory Visit (INDEPENDENT_AMBULATORY_CARE_PROVIDER_SITE_OTHER): Payer: 59 | Admitting: Family Medicine

## 2013-03-17 ENCOUNTER — Encounter: Payer: Self-pay | Admitting: Family Medicine

## 2013-03-17 VITALS — BP 110/60 | HR 68 | Temp 98.7°F | Resp 16 | Wt 116.6 lb

## 2013-03-17 DIAGNOSIS — M999 Biomechanical lesion, unspecified: Secondary | ICD-10-CM

## 2013-03-17 DIAGNOSIS — M9981 Other biomechanical lesions of cervical region: Secondary | ICD-10-CM

## 2013-03-17 DIAGNOSIS — M62838 Other muscle spasm: Secondary | ICD-10-CM | POA: Insufficient documentation

## 2013-03-17 MED ORDER — MELOXICAM 15 MG PO TABS: 15.0000 mg | ORAL_TABLET | Freq: Every day | ORAL | Status: DC

## 2013-03-17 MED ORDER — TRAMADOL HCL 50 MG PO TABS: 50.0000 mg | ORAL_TABLET | Freq: Three times a day (TID) | ORAL | Status: DC | PRN

## 2013-03-17 NOTE — Assessment & Plan Note (Signed)
Patient does have some muscle imbalances and was given home exercise program to work on strengthening and range of motion. We will give her further exercises at followup in 2-3 weeks. Patient did have osteopathic manipulation today and did respond well. Anti-inflammatories were given as well as tramadol at night if necessary. Once again we'll see patient again in 2 weeks for further evaluation.

## 2013-03-17 NOTE — Assessment & Plan Note (Signed)
Decision today to treat with OMT was based on Physical Exam  After verbal consent patient was treated with HVLA ME techniques in cervical, thoracic, rib, lumbar areas  Patient tolerated the procedure well with improvement in symptoms  Patient given exercises, stretches and lifestyle modifications  See medications in patient instructions if given  Patient will follow up in 2 weeks  

## 2013-03-17 NOTE — Patient Instructions (Signed)
Very nice to meet you Meloxicam daily for the next week then as needed Tramadol at night if needed Exercises 4 times a week.  Come back again in 2 weeks.

## 2013-03-17 NOTE — Progress Notes (Signed)
   CC: Neck pain.   HPI: Patient is a very pleasant 28 year old female coming in with neck pain. 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 Patient states that this pain seems to be localized without any significant radiation down the arm any numbness or weakness. Patient has responded well to steroids in the past. Patient would like to know what is occurring. Patient denies any fevers or chills or any abnormal weight loss. Patient also denies any dizziness lightheadedness or any changes in vision. Patient states that ice, heat, as well as anti-inflammatories do seem to help as well. Patient though is nervous about going to sleep because she never wants to wake up with this pain. Patient was the pain 8/10 in severity.   Past medical, surgical, family and social history reviewed. Medications reviewed all in the electronic medical record.   Review of Systems: No headache, visual changes, nausea, vomiting, diarrhea, constipation, dizziness, abdominal pain, skin rash, fevers, chills, night sweats, weight loss, swollen lymph nodes, body aches, joint swelling, muscle aches, chest pain, shortness of breath, mood changes.   Objective:    Blood pressure 110/60, pulse 68, temperature 98.7 F (37.1 C), temperature source Oral, resp. rate 16, weight 116 lb 9.6 oz (52.889 kg), SpO2 99.00%.   General: No apparent distress alert and oriented x3 mood and affect normal, dressed appropriately.  HEENT: Pupils equal, extraocular movements intact Respiratory: Patient's speak in full sentences and does not appear short of breath Cardiovascular: No lower extremity edema, non tender, no erythema Skin: Warm dry intact with no signs of infection or rash on extremities or on axial skeleton. Abdomen: Soft nontender Neuro: Cranial nerves II through XII are intact, neurovascularly intact in all extremities with 2+ DTRs and 2+ pulses. Lymph: No  lymphadenopathy of posterior or anterior cervical chain or axillae bilaterally.  Gait normal with good balance and coordination.  MSK: Non tender with full range of motion and good stability and symmetric strength and tone of shoulders, elbows, wrist, hip, knee and ankles bilaterally.  Neck: Inspection unremarkable. No palpable stepoffs. Negative Spurling's maneuver. Limited range of motion secondary to pain and tightness. Patient does have a trigger point in the posterior right trapezius muscle. Severe tightness of the paraspinal musculature of the cervical spine. Grip strength and sensation normal in bilateral hands Strength good C4 to T1 distribution No sensory change to C4 to T1 Negative Hoffman sign bilaterally Reflexes normal  OMT Physical Exam  Standing structural       Occiput right higher  Shoulder right higher  Inferior angle of scapula  Illiac crest neutral  Medial malleolus neutral    Cervical  C2 extended rotated and side bent right C4 flexed rotated and side bent left C7 flexed rotated and side bent right  Thoracic T1 extended rotated and side bent right foot elevated first rib T5 extended rotated and side bent left  Lumbar L2 flexed rotated inside that right Sacrum Neutral  Illium Neutral   Impression and Recommendations:     This case required medical decision making of moderate complexity.

## 2013-03-17 NOTE — Progress Notes (Signed)
Pre-visit discussion using our clinic review tool. No additional management support is needed unless otherwise documented below in the visit note.  

## 2013-03-17 NOTE — Assessment & Plan Note (Signed)
Decision today to treat with OMT was based on Physical Exam  After verbal consent patient was treated with HVLA ME techniques in cervical, thoracic, rib, lumbar areas  Patient tolerated the procedure well with improvement in symptoms  Patient given exercises, stretches and lifestyle modifications  See medications in patient instructions if given  Patient will follow up in 2 weeks

## 2013-04-02 ENCOUNTER — Encounter: Payer: Self-pay | Admitting: Family Medicine

## 2013-04-02 ENCOUNTER — Ambulatory Visit (INDEPENDENT_AMBULATORY_CARE_PROVIDER_SITE_OTHER): Payer: 59 | Admitting: Family Medicine

## 2013-04-02 VITALS — BP 110/60 | HR 59 | Temp 98.2°F | Resp 16 | Wt 116.1 lb

## 2013-04-02 DIAGNOSIS — M62838 Other muscle spasm: Secondary | ICD-10-CM

## 2013-04-02 DIAGNOSIS — M999 Biomechanical lesion, unspecified: Secondary | ICD-10-CM

## 2013-04-02 DIAGNOSIS — M9981 Other biomechanical lesions of cervical region: Secondary | ICD-10-CM

## 2013-04-02 MED ORDER — CYCLOBENZAPRINE HCL 5 MG PO TABS: 5.0000 mg | ORAL_TABLET | Freq: Three times a day (TID) | ORAL | Status: DC | PRN

## 2013-04-02 NOTE — Assessment & Plan Note (Signed)
Decision today to treat with OMT was based on Physical Exam  After verbal consent patient was treated with HVLA ME techniques in cervical, thoracic, rib, lumbar areas  Patient tolerated the procedure well with improvement in symptoms  Patient given exercises, stretches and lifestyle modifications  See medications in patient instructions if given  Patient will follow up in 3-4 weeks 

## 2013-04-02 NOTE — Assessment & Plan Note (Signed)
Patient is responding very slowly to osteopathic manipulation as well as a home exercise program. We discussed over-the-counter medications that can be beneficial. Discuss work positioning of his can be helpful. Patient will come back again in 3-4 weeks.

## 2013-04-02 NOTE — Progress Notes (Signed)
   CC: Neck pain follow up.    HPI: Patient is a very pleasant 28 year old female coming in with neck pain.  Follow up. Patient was seen for and was diagnosed with more of a musculoskeletal complaints secondary to muscle imbalances. Patient was given home exercises and states that she is doing much better. Patient states that she has been able to work longer hours without any significant pain. Patient still has some mild discomfort but overall but states that she is approximately 60-70% better. No new symptoms.  Past medical, surgical, family and social history reviewed. Medications reviewed all in the electronic medical record.   Review of Systems: No headache, visual changes, nausea, vomiting, diarrhea, constipation, dizziness, abdominal pain, skin rash, fevers, chills, night sweats, weight loss, swollen lymph nodes, body aches, joint swelling, muscle aches, chest pain, shortness of breath, mood changes.   Objective:    Blood pressure 110/60, pulse 59, temperature 98.2 F (36.8 C), temperature source Oral, resp. rate 16, weight 116 lb 1.9 oz (52.672 kg), SpO2 99.00%.   General: No apparent distress alert and oriented x3 mood and affect normal, dressed appropriately.  HEENT: Pupils equal, extraocular movements intact Respiratory: Patient's speak in full sentences and does not appear short of breath Cardiovascular: No lower extremity edema, non tender, no erythema Skin: Warm dry intact with no signs of infection or rash on extremities or on axial skeleton. Abdomen: Soft nontender Neuro: Cranial nerves II through XII are intact, neurovascularly intact in all extremities with 2+ DTRs and 2+ pulses. Lymph: No lymphadenopathy of posterior or anterior cervical chain or axillae bilaterally.  Gait normal with good balance and coordination.  MSK: Non tender with full range of motion and good stability and symmetric strength and tone of shoulders, elbows, wrist, hip, knee and ankles bilaterally.   Neck: Inspection unremarkable. No palpable stepoffs. Negative Spurling's maneuver. Limited range of motion secondary to pain and tightness. Patient does have a trigger point in the posterior right trapezius muscle. Severe tightness of the paraspinal musculature of the cervical spine. Grip strength and sensation normal in bilateral hands Strength good C4 to T1 distribution No sensory change to C4 to T1 Negative Hoffman sign bilaterally Reflexes normal  OMT Physical Exam  Cervical  C2 extended rotated and side bent left C4 flexed rotated and side bent left   Thoracic T5 extended rotated and side bent left  Lumbar L2 flexed rotated inside that right Sacrum Left on left Illium Neutral   Impression and Recommendations:     This case required medical decision making of moderate complexity.

## 2013-04-02 NOTE — Patient Instructions (Signed)
Good to see you 2 tennis ball duct tape together lay on it for 5 minutes.  Exercises 3 times a week Tennis ball between shoulder blades at work in chair if you can.  Come back in 4 weeks.

## 2013-04-02 NOTE — Assessment & Plan Note (Signed)
Decision today to treat with OMT was based on Physical Exam  After verbal consent patient was treated with HVLA ME techniques in cervical, thoracic, rib, lumbar areas  Patient tolerated the procedure well with improvement in symptoms  Patient given exercises, stretches and lifestyle modifications  See medications in patient instructions if given  Patient will follow up in 3-4 weeks

## 2013-04-02 NOTE — Progress Notes (Signed)
Pre-visit discussion using our clinic review tool. No additional management support is needed unless otherwise documented below in the visit note.  

## 2013-04-30 ENCOUNTER — Ambulatory Visit: Payer: 59 | Admitting: Family Medicine

## 2013-06-04 ENCOUNTER — Encounter: Payer: Self-pay | Admitting: Physician Assistant

## 2013-06-04 ENCOUNTER — Ambulatory Visit (INDEPENDENT_AMBULATORY_CARE_PROVIDER_SITE_OTHER): Payer: 59 | Admitting: Physician Assistant

## 2013-06-04 VITALS — BP 102/66 | HR 81 | Resp 14 | Ht 62.0 in | Wt 120.0 lb

## 2013-06-04 DIAGNOSIS — R059 Cough, unspecified: Secondary | ICD-10-CM

## 2013-06-04 DIAGNOSIS — R05 Cough: Secondary | ICD-10-CM

## 2013-06-04 MED ORDER — HYDROCOD POLST-CHLORPHEN POLST 10-8 MG/5ML PO LQCR: 5.0000 mL | Freq: Two times a day (BID) | ORAL | Status: DC | PRN

## 2013-06-04 MED ORDER — BENZONATATE 100 MG PO CAPS: 100.0000 mg | ORAL_CAPSULE | Freq: Two times a day (BID) | ORAL | Status: DC | PRN

## 2013-06-04 NOTE — Progress Notes (Signed)
Patient presents to clinic today c/o 2 days of a non-productive cough.  Notes a lot of renovations at work with dust.  No fever, chills, or aches.  Some chest tenderness with cough.  No SOB, no wheezing.  Denies history of asthma.  Denies recent travel or sick contact.  Denies hx of allergies.  Denies acid reflux or globus.  Past Medical History  Diagnosis Date  . History of chicken pox     Current Outpatient Prescriptions on File Prior to Visit  Medication Sig Dispense Refill  . cyclobenzaprine (FLEXERIL) 5 MG tablet Take 1 tablet (5 mg total) by mouth every 8 (eight) hours as needed for muscle spasms.  30 tablet  1  . dibucaine (NUPERCAINAL) 1 % OINT Place 1 application rectally as needed.  1 Tube  0  . HYDROcodone-acetaminophen (NORCO) 5-325 MG per tablet Take 1 tablet by mouth every 6 (six) hours as needed.  20 tablet  0  . meloxicam (MOBIC) 15 MG tablet Take 1 tablet (15 mg total) by mouth daily.  30 tablet  0  . Prenatal Vit-Fe Fumarate-FA (PRENATAL MULTIVITAMIN) TABS Take 1 tablet by mouth at bedtime.        . traMADol (ULTRAM) 50 MG tablet Take 1 tablet (50 mg total) by mouth every 8 (eight) hours as needed.  30 tablet  0   No current facility-administered medications on file prior to visit.    No Known Allergies  Family History  Problem Relation Age of Onset  . Mental illness Sister     anxiety  . Seizures Brother   . Cancer Paternal Grandmother     ovarian  . Miscarriages / [place] Mother   . Cancer Paternal Aunt     multiple types  . Breast cancer Other     History   Social History  . Marital Status: Married    Spouse Name: N/A    Number of Children: N/A  . Years of Education: N/A   Social History Main Topics  . Smoking status: Never Smoker   . Smokeless tobacco: None  . Alcohol Use: Yes  . Drug Use: No  . Sexual Activity: Yes   Other Topics Concern  . None   Social History Narrative   ** Merged History Encounter **       ** Data from: 02/08/11  Enc Dept: [facility]       ** Data from: 09/28/11 Enc Dept: [id number]   Married, lives with spouse and dtr, stay home mom   Review of Systems - See HPI.  All other ROS are negative.  BP 102/66  Pulse 81  Resp 14  Ht 5\' 2"  (1.575 m)  Wt 120 lb (54.432 kg)  BMI 21.94 kg/m2  SpO2 99%  LMP 06/04/2013  Physical Exam  Vitals reviewed. Constitutional: She is oriented to person, place, and time and well-developed, well-nourished, and in no distress.  HENT:  Head: Normocephalic and atraumatic.  Right Ear: External ear normal.  Left Ear: External ear normal.  Nose: Nose normal.  Mouth/Throat: Oropharynx is clear and moist. No oropharyngeal exudate.  TM within normal limits bilaterally.  Eyes: Conjunctivae are normal. Pupils are equal, round, and reactive to light.  Neck: Neck supple.  Cardiovascular: Normal rate, regular rhythm, normal heart sounds and intact distal pulses.   Pulmonary/Chest: Effort normal and breath sounds normal. No respiratory distress. She has no wheezes. She has no rales. She exhibits no tenderness.  Lymphadenopathy:    She has no cervical adenopathy.  Neurological: She is alert and oriented to person, place, and time.  Skin: Skin is warm and dry. No rash noted.  Psychiatric: Affect normal.   Assessment/Plan: Cough Possibly allergy-related giving no other symptoms but environmental exposures.  Rx Tussionex and Tessalon Perles for cough. Increase fluids.  Daily Claritin. Saline nasal spray and Flonase.  Humidifier in bedroom.  Call or return to clinic if symptoms are not improving.

## 2013-06-04 NOTE — Assessment & Plan Note (Signed)
Possibly allergy-related giving no other symptoms but environmental exposures.  Rx Tussionex and Tessalon Perles for cough. Increase fluids.  Daily Claritin. Saline nasal spray and Flonase.  Humidifier in bedroom.  Call or return to clinic if symptoms are not improving.

## 2013-06-04 NOTE — Patient Instructions (Signed)
I feel that your cough is likely stemming from allergies and possible the onset of a virus.  Increase fluid intake.  Rest.   Use Flonase or Saline nasal spray daily. Take a daily claritin.  Place a humidifier in your bedroom.  Use Tussionex for nighttime cough and Tessalon Perles for daytime cough.  Call or return to clinic if symptoms are not improving.

## 2013-12-02 ENCOUNTER — Ambulatory Visit (INDEPENDENT_AMBULATORY_CARE_PROVIDER_SITE_OTHER): Payer: 59 | Admitting: Family Medicine

## 2013-12-02 ENCOUNTER — Encounter: Payer: Self-pay | Admitting: Family Medicine

## 2013-12-02 VITALS — BP 92/64 | HR 73 | Ht 62.0 in | Wt 121.0 lb

## 2013-12-02 DIAGNOSIS — M9902 Segmental and somatic dysfunction of thoracic region: Secondary | ICD-10-CM

## 2013-12-02 DIAGNOSIS — M542 Cervicalgia: Secondary | ICD-10-CM

## 2013-12-02 DIAGNOSIS — M999 Biomechanical lesion, unspecified: Secondary | ICD-10-CM | POA: Insufficient documentation

## 2013-12-02 DIAGNOSIS — M9901 Segmental and somatic dysfunction of cervical region: Secondary | ICD-10-CM

## 2013-12-02 DIAGNOSIS — M62838 Other muscle spasm: Secondary | ICD-10-CM

## 2013-12-02 DIAGNOSIS — M6248 Contracture of muscle, other site: Secondary | ICD-10-CM

## 2013-12-02 DIAGNOSIS — M9908 Segmental and somatic dysfunction of rib cage: Secondary | ICD-10-CM

## 2013-12-02 NOTE — Assessment & Plan Note (Signed)
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 Patient then had unfortunately a muscle spasm in the neck. Seems to be improving but we did do some mild indirect osteopathic manipulation. We will warned patient about manipulation that we will avoid any significant high velocity while patient is pregnant at this time. We did an do a direct FPR but otherwise soft tissue and muscle energy techniques were used. Patient tolerated the procedure very well with some mild increase in improvement in pain. Patient encouraged to do the exercises 3 times a week. Tylenol as well as Benadryl for any pain relief. Patient and will follow up with me again in 3 weeks for further evaluation.

## 2013-12-02 NOTE — Patient Instructions (Signed)
Good to see you Ice or heat Exercises 3 times a week.  B6 2000mg  daily.  Physical therapy will be calling you Benedryl 25 mg at night.  Would love to see you again in 2-3 weeks.

## 2013-12-02 NOTE — Progress Notes (Signed)
   CC: Neck pain follow up.    HPI: Patient is a very pleasant 28 year old female coming in with neck pain is now [redacted] weeks pregnant.  Patient recently had a cold and unfortunately was coughing a lot and has significant pain on the left side of her neck. Patient has had elevated rib previously. Patient was having severity and was given Flexeril which seemed to help somewhat. Patient states over the course last 24 hours it has improved by about 20%. Patient still has difficulty mostly on the left but denies any radiation of the arm any numbness or tingling. No weakness. Patient rates the severity is 6/10. Try Tylenol with no significant benefit.  Past medical, surgical, family and social history reviewed. Medications reviewed all in the electronic medical record.   Review of Systems: No headache, visual changes, nausea, vomiting, diarrhea, constipation, dizziness, abdominal pain, skin rash, fevers, chills, night sweats, weight loss, swollen lymph nodes, body aches, joint swelling, muscle aches, chest pain, shortness of breath, mood changes.   Objective:    Blood pressure 92/64, pulse 73, height 5\' 2"  (1.575 m), weight 121 lb (54.885 kg), SpO2 98.00%.   General: No apparent distress alert and oriented x3 mood and affect normal, dressed appropriately.  HEENT: Pupils equal, extraocular movements intact Respiratory: Patient's speak in full sentences and does not appear short of breath Cardiovascular: No lower extremity edema, non tender, no erythema Skin: Warm dry intact with no signs of infection or rash on extremities or on axial skeleton. Abdomen: Soft nontender not gravid Neuro: Cranial nerves II through XII are intact, neurovascularly intact in all extremities with 2+ DTRs and 2+ pulses. Lymph: No lymphadenopathy of posterior or anterior cervical chain or axillae bilaterally.  Gait normal with good balance and coordination.  MSK: Non tender with full range of motion and good stability and  symmetric strength and tone of shoulders, elbows, wrist, hip, knee and ankles bilaterally.  Neck: Inspection unremarkable. No palpable stepoffs. Negative Spurling's maneuver. Limited range of motion secondary to pain and tightness. Patient does have a trigger point in the posterior left trapezius muscle. Severe tightness of the paraspinal musculature of the cervical spine. Grip strength and sensation normal in bilateral hands Strength good C4 to T1 distribution No sensory change to C4 to T1 Negative Hoffman sign bilaterally Reflexes normal  OMT Physical Exam  Cervical  C2 extended rotated and side bent left C4 flexed rotated and side bent left   Thoracic T1 extended rotated and side bent left with elevated first rib P2 extended rotated and side bent right with inhaled second rib T5 extended rotated and side bent left    Impression and Recommendations:     This case required medical decision making of moderate complexity.

## 2013-12-02 NOTE — Assessment & Plan Note (Signed)
Decision today to treat with OMT was based on Physical Exam  After verbal consent patient was treated with  ME, FPR, soft tissue techniques in cervical, thoracic, rib,  thoracic areas  Patient tolerated the procedure well with improvement in symptoms  Patient given exercises, stretches and lifestyle modifications  See medications in patient instructions if given  Patient will follow up in 3 weeks

## 2013-12-10 ENCOUNTER — Ambulatory Visit: Payer: 59 | Attending: Family Medicine | Admitting: Physical Therapy

## 2013-12-10 DIAGNOSIS — R293 Abnormal posture: Secondary | ICD-10-CM | POA: Insufficient documentation

## 2013-12-10 DIAGNOSIS — M25511 Pain in right shoulder: Secondary | ICD-10-CM | POA: Diagnosis not present

## 2013-12-10 DIAGNOSIS — M542 Cervicalgia: Secondary | ICD-10-CM | POA: Insufficient documentation

## 2013-12-10 DIAGNOSIS — Z5189 Encounter for other specified aftercare: Secondary | ICD-10-CM | POA: Insufficient documentation

## 2013-12-17 ENCOUNTER — Other Ambulatory Visit: Payer: Self-pay | Admitting: Obstetrics and Gynecology

## 2013-12-17 ENCOUNTER — Ambulatory Visit (INDEPENDENT_AMBULATORY_CARE_PROVIDER_SITE_OTHER): Payer: 59 | Admitting: Family Medicine

## 2013-12-17 ENCOUNTER — Encounter: Payer: Self-pay | Admitting: Family Medicine

## 2013-12-17 VITALS — BP 116/72 | HR 120 | Ht 62.0 in | Wt 123.0 lb

## 2013-12-17 DIAGNOSIS — M9908 Segmental and somatic dysfunction of rib cage: Secondary | ICD-10-CM

## 2013-12-17 DIAGNOSIS — M6248 Contracture of muscle, other site: Secondary | ICD-10-CM

## 2013-12-17 DIAGNOSIS — M999 Biomechanical lesion, unspecified: Secondary | ICD-10-CM

## 2013-12-17 DIAGNOSIS — M9902 Segmental and somatic dysfunction of thoracic region: Secondary | ICD-10-CM

## 2013-12-17 DIAGNOSIS — M9901 Segmental and somatic dysfunction of cervical region: Secondary | ICD-10-CM

## 2013-12-17 DIAGNOSIS — M62838 Other muscle spasm: Secondary | ICD-10-CM

## 2013-12-17 NOTE — Assessment & Plan Note (Signed)
Decision today to treat with OMT was based on Physical Exam  After verbal consent patient was treated with  ME, FPR, soft tissue techniques in cervical, thoracic, rib,  thoracic areas  Patient tolerated the procedure well with improvement in symptoms  Patient given exercises, stretches and lifestyle modifications  See medications in patient instructions if given  Patient will follow up in 3 weeks

## 2013-12-17 NOTE — Assessment & Plan Note (Signed)
Patient is doing remarkably well of conservative therapy at this time. Patient encouraged to continue with postural exercises in the home exercises on a fairly regular basis. We discussed the icing protocol. We'll still avoid any type of over-the-counter medications other than Tylenol with patient being pregnant. Patient is doing very well and we will outpatient visits to every 3 weeks at this time.

## 2013-12-17 NOTE — Patient Instructions (Signed)
Good to see you Ice is still your friend You are doing amazing, keep up the exercises when you can.  See me again 3 weeks.

## 2013-12-17 NOTE — Progress Notes (Signed)
   CC: Neck pain follow up.    HPI: Patient is a very pleasant 28 year old female coming in with neck pain is now [redacted] weeks pregnant.  Patient has been feeling significantly better in the neck. Patient denies any radiation into her arms or any numbness. Patient states that overall she is feeling well. Patient has gone to formal physical therapy for states that this has not been significantly beneficial. Patient states that she has minimal neck pain. Patient feels like she has minimal round ligament pain. Mild lower back pain.   Past medical, surgical, family and social history reviewed. Medications reviewed all in the electronic medical record.   Review of Systems: No headache, visual changes, nausea, vomiting, diarrhea, constipation, dizziness, abdominal pain, skin rash, fevers, chills, night sweats, weight loss, swollen lymph nodes, body aches, joint swelling, muscle aches, chest pain, shortness of breath, mood changes.   Objective:    Blood pressure 116/72, pulse 120, height 5\' 2"  (1.575 m), weight 123 lb (55.792 kg), SpO2 94.00%.   General: No apparent distress alert and oriented x3 mood and affect normal, dressed appropriately.  HEENT: Pupils equal, extraocular movements intact Respiratory: Patient's speak in full sentences and does not appear short of breath Cardiovascular: No lower extremity edema, non tender, no erythema Skin: Warm dry intact with no signs of infection or rash on extremities or on axial skeleton. Abdomen: Soft nontender not gravid Neuro: Cranial nerves II through XII are intact, neurovascularly intact in all extremities with 2+ DTRs and 2+ pulses. Lymph: No lymphadenopathy of posterior or anterior cervical chain or axillae bilaterally.  Gait normal with good balance and coordination.  MSK: Non tender with full range of motion and good stability and symmetric strength and tone of shoulders, elbows, wrist, hip, knee and ankles bilaterally.  Neck: Inspection  unremarkable. No palpable stepoffs. Negative Spurling's maneuver. Patient has full range of motion of the neck today.. Grip strength and sensation normal in bilateral hands Strength good C4 to T1 distribution No sensory change to C4 to T1 Negative Hoffman sign bilaterally Reflexes normal  OMT Physical Exam  Cervical  C2 extended rotated and side bent left C4 flexed rotated and side bent left   Thoracic  T2 extended rotated and side bent right with inhaled second rib T5 extended rotated and side bent left    Impression and Recommendations:     This case required medical decision making of moderate complexity.

## 2013-12-18 ENCOUNTER — Ambulatory Visit: Payer: 59 | Admitting: Physical Therapy

## 2013-12-19 LAB — CYTOLOGY - PAP

## 2013-12-22 ENCOUNTER — Encounter: Payer: Self-pay | Admitting: Family Medicine

## 2013-12-24 ENCOUNTER — Encounter: Payer: 59 | Admitting: Physical Therapy

## 2013-12-31 ENCOUNTER — Ambulatory Visit: Payer: 59 | Admitting: Physical Therapy

## 2014-01-02 ENCOUNTER — Other Ambulatory Visit (HOSPITAL_COMMUNITY): Payer: Self-pay | Admitting: Obstetrics and Gynecology

## 2014-01-02 DIAGNOSIS — Z3A14 14 weeks gestation of pregnancy: Secondary | ICD-10-CM

## 2014-01-02 DIAGNOSIS — O283 Abnormal ultrasonic finding on antenatal screening of mother: Secondary | ICD-10-CM

## 2014-01-07 ENCOUNTER — Ambulatory Visit: Payer: 59 | Admitting: Physical Therapy

## 2014-01-07 ENCOUNTER — Ambulatory Visit: Payer: 59 | Admitting: Family Medicine

## 2014-01-08 ENCOUNTER — Encounter (HOSPITAL_COMMUNITY): Payer: 59

## 2014-01-08 ENCOUNTER — Encounter (HOSPITAL_COMMUNITY): Payer: Self-pay

## 2014-01-08 ENCOUNTER — Ambulatory Visit (HOSPITAL_COMMUNITY): Payer: 59

## 2014-01-08 ENCOUNTER — Ambulatory Visit (HOSPITAL_COMMUNITY)
Admission: RE | Admit: 2014-01-08 | Discharge: 2014-01-08 | Disposition: A | Discharge status: Home / Self Care | Payer: 59 | Source: Ambulatory Visit | Attending: Obstetrics and Gynecology | Admitting: Obstetrics and Gynecology

## 2014-01-08 ENCOUNTER — Other Ambulatory Visit (HOSPITAL_COMMUNITY): Payer: Self-pay | Admitting: Obstetrics and Gynecology

## 2014-01-08 DIAGNOSIS — Z3A14 14 weeks gestation of pregnancy: Secondary | ICD-10-CM

## 2014-01-08 DIAGNOSIS — O283 Abnormal ultrasonic finding on antenatal screening of mother: Secondary | ICD-10-CM

## 2014-01-08 DIAGNOSIS — O09299 Supervision of pregnancy with other poor reproductive or obstetric history, unspecified trimester: Secondary | ICD-10-CM | POA: Insufficient documentation

## 2014-01-08 DIAGNOSIS — O358XX9 Maternal care for other (suspected) fetal abnormality and damage, other fetus: Secondary | ICD-10-CM | POA: Diagnosis not present

## 2014-01-08 DIAGNOSIS — Z36 Encounter for antenatal screening of mother: Secondary | ICD-10-CM | POA: Insufficient documentation

## 2014-01-09 ENCOUNTER — Other Ambulatory Visit (HOSPITAL_COMMUNITY): Payer: Self-pay

## 2014-01-14 ENCOUNTER — Ambulatory Visit (HOSPITAL_COMMUNITY)
Admission: RE | Admit: 2014-01-14 | Discharge: 2014-01-14 | Disposition: A | Discharge status: Home / Self Care | Payer: 59 | Source: Ambulatory Visit | Attending: Obstetrics and Gynecology | Admitting: Obstetrics and Gynecology

## 2014-01-14 ENCOUNTER — Ambulatory Visit: Payer: 59 | Admitting: Physical Therapy

## 2014-01-14 DIAGNOSIS — O09292 Supervision of pregnancy with other poor reproductive or obstetric history, second trimester: Secondary | ICD-10-CM | POA: Diagnosis not present

## 2014-01-14 DIAGNOSIS — O402XX0 Polyhydramnios, second trimester, not applicable or unspecified: Secondary | ICD-10-CM | POA: Insufficient documentation

## 2014-01-14 DIAGNOSIS — Z3A15 15 weeks gestation of pregnancy: Secondary | ICD-10-CM | POA: Diagnosis not present

## 2014-01-14 DIAGNOSIS — Z315 Encounter for genetic counseling: Secondary | ICD-10-CM | POA: Insufficient documentation

## 2014-01-14 DIAGNOSIS — Z3A14 14 weeks gestation of pregnancy: Secondary | ICD-10-CM

## 2014-01-14 DIAGNOSIS — IMO0001 Reserved for inherently not codable concepts without codable children: Secondary | ICD-10-CM

## 2014-01-14 DIAGNOSIS — O358XX0 Maternal care for other (suspected) fetal abnormality and damage, not applicable or unspecified: Secondary | ICD-10-CM | POA: Diagnosis not present

## 2014-01-14 DIAGNOSIS — IMO0002 Reserved for concepts with insufficient information to code with codable children: Secondary | ICD-10-CM | POA: Insufficient documentation

## 2014-01-14 DIAGNOSIS — O283 Abnormal ultrasonic finding on antenatal screening of mother: Secondary | ICD-10-CM

## 2014-01-14 NOTE — Progress Notes (Signed)
Genetic Counseling  High-Risk Gestation Note  Appointment Date:  01/14/2014 Referred By: Marylynn Pearson, MD Date of Birth:  December 03, 1985  Pregnancy History: [id number] Estimated Date of Delivery: 07/07/14 Estimated Gestational Age: [redacted]w[redacted]d  I met with Mrs. PLaurel Martinez her husband, [name] for genetic counseling because of abnormal ultrasound findings.  We met after their last pregnancy with similar findings.  [name] had ultrasound last week which identified a cystic hygroma and hydrops.  Ultrasound today revealed those same findings, as well as legs fixed in extension which did not move during the exam. For full details of the ultrasound findings, please see the ultrasound report documented separately.   We discussed these findings in detail.  Specifically, we discussed that congenital anomalies can occur as isolated, nonsyndromic birth defects, or as features of an underlying genetic syndrome.  The risk for a genetic etiology increases with the presence of multiple fetal anatomic differences. [name] has had a pregnancy with these findings, which resulted in a fetal demise.  After that delivery, postnatal chromosome analysis was normal.  Thus, we discussed that if this baby had the same condition, we would not expect there to be a chromosome difference present in this pregnancy either.  However, as the most common difference identified in the presence of a cystic hygroma and hydrops is chromosome aneuploidy, evaluation for those differences is reasonable.  [name] reported that she had a normal cell free DNA (Panorama extended panel).  We discussed that while this is not diagnostic, it certainly reduces the likelihood for aneuploidy significantly.    [name] was then counseled regarding the availability of amniocentesis (at 15+ weeks gestation) including the associated risks, benefits, and limitations.  She understands that chromosome analysis can be performed both prenatally (amniotic  fluid) and postnatally (peripheral blood or cord blood).  Additionally, we discussed the availability of microarray analysis, which can also be performed pre and postnatally.  They were counseled that microarray analysis is a molecular based technique in which a test sample of DNA (fetal) is compared to a reference (normal) genome in order to determine if the test sample has any extra or missing genetic information.  Microarray analysis allows for the detection of genetic deletions and duplications that are 1458times smaller than those identified by routine chromosome analysis.  We discussed that recent publications show that approximately 6% of patients with an abnormal fetal ultrasound and a normal fetal karyotype had a significant microdeletion/microduplication detected by prenatal microarray analysis.    We then discussed other possible explanations for the ultrasound findings including single gene conditions.  Single gene conditions are typically tested for postnatally, based on the recommendation of a medical geneticist, unless ultrasound findings or the family history are strongly suggestive of a specific syndrome.  We discussed the availability of carrier testing for many single gene conditions (up to 274 with the Horizon panel), but that these are not necessarily the most common conditions, or the ones associated with hydrops.  The ultrasound finding of decreased fetal movement and fixed limbs may suggest that there is a underlying neuromuscular condition.  They were counseled that the prognosis and postnatal management depend on the underlying etiology of the fetal differences.  We discussed that the prognosis is expected to be poor based on these findings and their previous IUFD.   However, given that they have a healthy daughter, we know that there is likely no greater than a 25% recurrence chance for a similar condition in a subsequent pregnancy.  They understand that concerns for their  pregnancy and wish to proceed with expectant management.  They would like to pursue genetic evaluation postnatally, as well as autopsy and chromosome analysis with microarray.  Depending upon the results of those evaluations, they may wish to pursue an extended panel carrier screening platform (such as Horizon).    Given the nature of the session today and that the family histories were previously obtained, we did not review the family history in detail today. Without further information regarding the provided family history, an accurate genetic risk cannot be calculated. Further genetic counseling is warranted if more information is obtained.  [name] denied exposure to environmental toxins or chemical agents. She denied the use of alcohol, tobacco or street drugs. She denied significant viral illnesses during the course of her pregnancy.   I counseled this couple regarding the above risks and available options.  The approximate face-to-face time with the genetic counselor was 60 minutes.

## 2014-01-21 ENCOUNTER — Ambulatory Visit (HOSPITAL_COMMUNITY)
Admission: RE | Admit: 2014-01-21 | Discharge: 2014-01-21 | Disposition: A | Discharge status: Home / Self Care | Payer: 59 | Source: Ambulatory Visit | Attending: Obstetrics and Gynecology | Admitting: Obstetrics and Gynecology

## 2014-01-21 ENCOUNTER — Encounter (HOSPITAL_COMMUNITY): Payer: Self-pay

## 2014-01-21 DIAGNOSIS — Z3A17 17 weeks gestation of pregnancy: Secondary | ICD-10-CM | POA: Diagnosis not present

## 2014-01-21 DIAGNOSIS — O09292 Supervision of pregnancy with other poor reproductive or obstetric history, second trimester: Secondary | ICD-10-CM | POA: Insufficient documentation

## 2014-01-21 DIAGNOSIS — O358XX9 Maternal care for other (suspected) fetal abnormality and damage, other fetus: Secondary | ICD-10-CM | POA: Insufficient documentation

## 2014-01-21 DIAGNOSIS — O283 Abnormal ultrasonic finding on antenatal screening of mother: Secondary | ICD-10-CM

## 2014-01-21 DIAGNOSIS — O3622X0 Maternal care for hydrops fetalis, second trimester, not applicable or unspecified: Secondary | ICD-10-CM | POA: Insufficient documentation

## 2014-01-23 ENCOUNTER — Ambulatory Visit (INDEPENDENT_AMBULATORY_CARE_PROVIDER_SITE_OTHER): Payer: 59 | Admitting: Internal Medicine

## 2014-01-23 ENCOUNTER — Other Ambulatory Visit (HOSPITAL_COMMUNITY): Payer: Self-pay | Admitting: Obstetrics and Gynecology

## 2014-01-23 ENCOUNTER — Encounter: Payer: Self-pay | Admitting: Internal Medicine

## 2014-01-23 VITALS — BP 118/68 | HR 80 | Temp 97.4°F | Wt 122.0 lb

## 2014-01-23 DIAGNOSIS — O283 Abnormal ultrasonic finding on antenatal screening of mother: Secondary | ICD-10-CM

## 2014-01-23 DIAGNOSIS — J019 Acute sinusitis, unspecified: Secondary | ICD-10-CM | POA: Insufficient documentation

## 2014-01-23 DIAGNOSIS — H66002 Acute suppurative otitis media without spontaneous rupture of ear drum, left ear: Secondary | ICD-10-CM

## 2014-01-23 DIAGNOSIS — H6690 Otitis media, unspecified, unspecified ear: Secondary | ICD-10-CM | POA: Insufficient documentation

## 2014-01-23 DIAGNOSIS — O3622X0 Maternal care for hydrops fetalis, second trimester, not applicable or unspecified: Secondary | ICD-10-CM | POA: Insufficient documentation

## 2014-01-23 DIAGNOSIS — J0100 Acute maxillary sinusitis, unspecified: Secondary | ICD-10-CM

## 2014-01-23 DIAGNOSIS — Z3A16 16 weeks gestation of pregnancy: Secondary | ICD-10-CM | POA: Insufficient documentation

## 2014-01-23 MED ORDER — CEFUROXIME AXETIL 500 MG PO TABS: 500.0000 mg | ORAL_TABLET | Freq: Two times a day (BID) | ORAL | Status: DC

## 2014-01-23 NOTE — Progress Notes (Signed)
   Subjective:    Patient ID: Janet Martinez, female    DOB: 06/14/1985, 28 y.o.   MRN: [id number]  Sinusitis This is a new problem. The current episode started in the past 7 days. The problem has been gradually worsening since onset. There has been no fever. The pain is moderate. Associated symptoms include chills, congestion, ear pain, headaches, neck pain and sinus pressure. Pertinent negatives include no coughing, diaphoresis or swollen glands. Past treatments include acetaminophen. The treatment provided no relief.     Pt is [redacted] weeks pregnant (fetus w/a fatal abnormality, unfortunately - arthrogryposis, cystic hygroma)     Review of Systems  Constitutional: Positive for chills. Negative for diaphoresis.  HENT: Positive for congestion, ear pain and sinus pressure.   Respiratory: Negative for cough.   Musculoskeletal: Positive for neck pain.  Neurological: Positive for headaches.       Objective:   Physical Exam  Constitutional: She appears well-developed. No distress.  HENT:  Head: Normocephalic.  Right Ear: External ear normal.  Left Ear: External ear normal.  Nose: Nose normal.  Mouth/Throat: Oropharynx is clear and moist.  Eyes: Conjunctivae are normal. Pupils are equal, round, and reactive to light. Right eye exhibits no discharge. Left eye exhibits no discharge.  Neck: Normal range of motion. Neck supple. No JVD present. No tracheal deviation present. No thyromegaly present.  Cardiovascular: Normal rate, regular rhythm and normal heart sounds.   Pulmonary/Chest: No stridor. No respiratory distress. She has no wheezes.  Abdominal: Soft. Bowel sounds are normal. She exhibits no distension and no mass. There is no tenderness. There is no rebound and no guarding.  Musculoskeletal: She exhibits no edema or tenderness.  Lymphadenopathy:    She has no cervical adenopathy.  Neurological: She displays normal reflexes. No cranial nerve deficit. She exhibits normal muscle tone.  Coordination normal.  Skin: No rash noted. No erythema.  Psychiatric: She has a normal mood and affect. Her behavior is normal. Judgment and thought content normal.  L TM is red eryth throat       Assessment & Plan:

## 2014-01-23 NOTE — Progress Notes (Signed)
Patient ID: Janet Martinez, female   DOB: January 28, 1986, 28 y.o.   MRN: [id number]

## 2014-01-23 NOTE — Assessment & Plan Note (Signed)
Ceftin x 10 d Nasonex nasal qd

## 2014-01-23 NOTE — Progress Notes (Signed)
Pre visit review using our clinic review tool, if applicable. No additional management support is needed unless otherwise documented below in the visit note. 

## 2014-01-28 ENCOUNTER — Ambulatory Visit (HOSPITAL_COMMUNITY)
Admission: RE | Admit: 2014-01-28 | Discharge: 2014-01-28 | Disposition: A | Discharge status: Home / Self Care | Payer: 59 | Source: Ambulatory Visit | Attending: Obstetrics and Gynecology | Admitting: Obstetrics and Gynecology

## 2014-01-28 DIAGNOSIS — O09292 Supervision of pregnancy with other poor reproductive or obstetric history, second trimester: Secondary | ICD-10-CM | POA: Insufficient documentation

## 2014-01-28 DIAGNOSIS — O283 Abnormal ultrasonic finding on antenatal screening of mother: Secondary | ICD-10-CM

## 2014-01-28 DIAGNOSIS — Z3A17 17 weeks gestation of pregnancy: Secondary | ICD-10-CM | POA: Insufficient documentation

## 2014-01-28 DIAGNOSIS — O3622X0 Maternal care for hydrops fetalis, second trimester, not applicable or unspecified: Secondary | ICD-10-CM | POA: Diagnosis not present

## 2014-02-03 ENCOUNTER — Ambulatory Visit (HOSPITAL_COMMUNITY): Payer: 59

## 2014-02-04 ENCOUNTER — Ambulatory Visit (HOSPITAL_COMMUNITY): Payer: 59

## 2014-02-05 ENCOUNTER — Encounter (HOSPITAL_COMMUNITY): Payer: Self-pay

## 2014-02-05 ENCOUNTER — Other Ambulatory Visit (HOSPITAL_COMMUNITY): Payer: Self-pay | Admitting: Obstetrics and Gynecology

## 2014-02-05 ENCOUNTER — Ambulatory Visit (HOSPITAL_COMMUNITY)
Admission: RE | Admit: 2014-02-05 | Discharge: 2014-02-05 | Disposition: A | Discharge status: Home / Self Care | Payer: 59 | Source: Ambulatory Visit | Attending: Obstetrics and Gynecology | Admitting: Obstetrics and Gynecology

## 2014-02-05 VITALS — BP 111/73 | HR 67 | Wt 120.8 lb

## 2014-02-05 DIAGNOSIS — O289 Unspecified abnormal findings on antenatal screening of mother: Secondary | ICD-10-CM | POA: Insufficient documentation

## 2014-02-05 DIAGNOSIS — O283 Abnormal ultrasonic finding on antenatal screening of mother: Secondary | ICD-10-CM

## 2014-02-05 DIAGNOSIS — O3622X0 Maternal care for hydrops fetalis, second trimester, not applicable or unspecified: Secondary | ICD-10-CM

## 2014-02-05 LAB — AP-AFP (ALPHA FETOPROTEIN)

## 2014-02-05 LAB — ROUTINE CHROMOSOME - KARYOTYPE

## 2014-02-06 DIAGNOSIS — Z3A18 18 weeks gestation of pregnancy: Secondary | ICD-10-CM | POA: Insufficient documentation

## 2014-02-16 ENCOUNTER — Ambulatory Visit (HOSPITAL_COMMUNITY)
Admission: RE | Admit: 2014-02-16 | Discharge: 2014-02-16 | Disposition: A | Discharge status: Home / Self Care | Payer: 59 | Source: Ambulatory Visit | Attending: Obstetrics and Gynecology | Admitting: Obstetrics and Gynecology

## 2014-02-16 ENCOUNTER — Other Ambulatory Visit (HOSPITAL_COMMUNITY): Payer: Self-pay | Admitting: Obstetrics and Gynecology

## 2014-02-16 ENCOUNTER — Encounter (HOSPITAL_COMMUNITY): Payer: Self-pay

## 2014-02-16 DIAGNOSIS — Z3A19 19 weeks gestation of pregnancy: Secondary | ICD-10-CM | POA: Diagnosis not present

## 2014-02-16 DIAGNOSIS — O3680X0 Pregnancy with inconclusive fetal viability, not applicable or unspecified: Secondary | ICD-10-CM | POA: Diagnosis not present

## 2014-02-16 DIAGNOSIS — O283 Abnormal ultrasonic finding on antenatal screening of mother: Secondary | ICD-10-CM

## 2014-02-16 DIAGNOSIS — O358XX9 Maternal care for other (suspected) fetal abnormality and damage, other fetus: Secondary | ICD-10-CM | POA: Diagnosis not present

## 2014-02-20 NOTE — L&D Delivery Note (Signed)
Patient delivered non - viable hydropic female with 1 push. Fetus very edematous Obvious Cystic Hygroma noted  No brisk bleeding noted Inserted 866mcg cytotec in rectum Waiting for delivery of placenta

## 2014-02-24 ENCOUNTER — Other Ambulatory Visit (HOSPITAL_COMMUNITY): Payer: Self-pay | Admitting: Obstetrics and Gynecology

## 2014-02-24 ENCOUNTER — Encounter (HOSPITAL_COMMUNITY): Payer: Self-pay

## 2014-02-24 ENCOUNTER — Ambulatory Visit (HOSPITAL_COMMUNITY)
Admission: RE | Admit: 2014-02-24 | Discharge: 2014-02-24 | Disposition: A | Discharge status: Home / Self Care | Payer: 59 | Source: Ambulatory Visit | Attending: Obstetrics and Gynecology | Admitting: Obstetrics and Gynecology

## 2014-02-24 ENCOUNTER — Other Ambulatory Visit (HOSPITAL_COMMUNITY): Payer: Self-pay

## 2014-02-24 VITALS — BP 109/78 | HR 64 | Wt 124.0 lb

## 2014-02-24 DIAGNOSIS — Z3A21 21 weeks gestation of pregnancy: Secondary | ICD-10-CM | POA: Diagnosis not present

## 2014-02-24 DIAGNOSIS — O09292 Supervision of pregnancy with other poor reproductive or obstetric history, second trimester: Secondary | ICD-10-CM | POA: Insufficient documentation

## 2014-02-24 DIAGNOSIS — O358XX0 Maternal care for other (suspected) fetal abnormality and damage, not applicable or unspecified: Secondary | ICD-10-CM | POA: Diagnosis not present

## 2014-02-24 DIAGNOSIS — O283 Abnormal ultrasonic finding on antenatal screening of mother: Secondary | ICD-10-CM

## 2014-02-24 NOTE — Progress Notes (Signed)
   02/24/14 1700  Clinical Encounter Type  Visited With Patient and family together (husband Janet Martinez)  Visit Type Follow-up;Spiritual support;Social support  Referral From [place] (chaplain via [place] [facility])  Spiritual Encounters  Spiritual Needs Emotional;Grief support  Stress Factors  Patient Stress Factors Loss of control;Major life changes (facing baby's lethal dx; hx similar loss; husband - new job)  Family Stress Factors Loss of control;Major life changes   Spoke with Janet Martinez and Janet Martinez for ca 30 minutes following their [place] appointment to offer further spiritual and emotional support as they prepare for baby [name]'s death and delivery.  They engaged readily, sharing about their history with grief (both through the loss of baby [name] and through anticipating [name]'s death), faith/sources of hope, family and church support, [name]'s emotional challenges of coworkers' cheerful responses to her pregnancy, and [name]'s beginning a new job (a positive move, starting 1/25) in the midst of their coping with day-to-day stress of wondering when [name] will pass away and induction will happen.  We particularly focused on their questions about how to support their living daughter, Janet Martinez (spelling?), age 30, through their grieving and her trying to make sense of their family's loss.  Talked about possibilities for supporting/including Janet Martinez after delivery (talk, visit, bereavement photos, etc), helping her learn about coping with strong feelings, and using [facility] for further support.  Family very appreciative of detailed support, acknowledging how helpful it can be to talk with someone beyond their personal sphere of family/friends/pastor.  Spiritual Care will continue to follow, and family aware of how to reach chaplains.  Please also page as needs arise:  [phone].  Thank you.  [name], [name]

## 2014-03-02 ENCOUNTER — Inpatient Hospital Stay (HOSPITAL_COMMUNITY)
Admission: AD | Admit: 2014-03-02 | Discharge: 2014-03-04 | DRG: 775 | Disposition: A | Discharge status: Home / Self Care | Payer: 59 | Source: Ambulatory Visit | Attending: Obstetrics and Gynecology | Admitting: Obstetrics and Gynecology

## 2014-03-02 ENCOUNTER — Encounter (HOSPITAL_COMMUNITY): Payer: Self-pay | Admitting: General Practice

## 2014-03-02 DIAGNOSIS — D181 Lymphangioma, any site: Secondary | ICD-10-CM | POA: Diagnosis present

## 2014-03-02 DIAGNOSIS — O3622X0 Maternal care for hydrops fetalis, second trimester, not applicable or unspecified: Secondary | ICD-10-CM | POA: Diagnosis present

## 2014-03-02 DIAGNOSIS — Z3A22 22 weeks gestation of pregnancy: Secondary | ICD-10-CM | POA: Diagnosis present

## 2014-03-02 DIAGNOSIS — O364XX0 Maternal care for intrauterine death, not applicable or unspecified: Principal | ICD-10-CM | POA: Diagnosis present

## 2014-03-02 DIAGNOSIS — IMO0002 Reserved for concepts with insufficient information to code with codable children: Secondary | ICD-10-CM | POA: Diagnosis present

## 2014-03-02 HISTORY — DX: Other specified health status: Z78.9

## 2014-03-02 LAB — COMPREHENSIVE METABOLIC PANEL
ALT: 14 U/L (ref 0–35)
ANION GAP: 7 (ref 5–15)
AST: 24 U/L (ref 0–37)
Albumin: 3.7 g/dL (ref 3.5–5.2)
Alkaline Phosphatase: 81 U/L (ref 39–117)
BILIRUBIN TOTAL: 0.3 mg/dL (ref 0.3–1.2)
BUN: 11 mg/dL (ref 6–23)
CO2: 22 mmol/L (ref 19–32)
Calcium: 9.3 mg/dL (ref 8.4–10.5)
Chloride: 108 mEq/L (ref 96–112)
Creatinine, Ser: 0.56 mg/dL (ref 0.50–1.10)
GFR calc Af Amer: >90 mL/min (ref >90)
GFR calc non Af Amer: >90 mL/min (ref >90)
Glucose, Bld: 94 mg/dL (ref 70–99)
POTASSIUM: 3.8 mmol/L (ref 3.5–5.1)
SODIUM: 137 mmol/L (ref 135–145)
TOTAL PROTEIN: 6.9 g/dL (ref 6.0–8.3)

## 2014-03-02 LAB — CBC
HEMATOCRIT: 41.4 % (ref 36.0–46.0)
Hemoglobin: 14.5 g/dL (ref 12.0–15.0)
MCH: 32.2 pg (ref 26.0–34.0)
MCHC: 35 g/dL (ref 30.0–36.0)
MCV: 92 fL (ref 78.0–100.0)
PLATELETS: 144 10*3/uL — AB (ref 150–400)
RBC: 4.5 MIL/uL (ref 3.87–5.11)
RDW: 13.2 % (ref 11.5–15.5)
WBC: 9.2 10*3/uL (ref 4.0–10.5)

## 2014-03-02 MED ORDER — ZOLPIDEM TARTRATE 5 MG PO TABS
5.0000 mg | ORAL_TABLET | Freq: Every evening | ORAL | Status: DC | PRN
  Administered 2014-03-02: 5 mg via ORAL
  Filled 2014-03-02: qty 1

## 2014-03-02 MED ORDER — LACTATED RINGERS IV SOLN
INTRAVENOUS | Status: DC
  Administered 2014-03-02 – 2014-03-03 (×2): via INTRAVENOUS

## 2014-03-02 MED ORDER — BUTORPHANOL TARTRATE 1 MG/ML IJ SOLN
1.0000 mg | INTRAMUSCULAR | Status: DC | PRN
  Administered 2014-03-03 (×2): 1 mg via INTRAVENOUS
  Filled 2014-03-02 (×2): qty 1

## 2014-03-02 MED ORDER — OXYCODONE-ACETAMINOPHEN 5-325 MG PO TABS
1.0000 | ORAL_TABLET | ORAL | Status: DC | PRN
  Filled 2014-03-02: qty 1

## 2014-03-02 MED ORDER — ACETAMINOPHEN 325 MG PO TABS
650.0000 mg | ORAL_TABLET | ORAL | Status: DC | PRN
  Administered 2014-03-03: 650 mg via ORAL
  Filled 2014-03-02: qty 2

## 2014-03-02 MED ORDER — OXYTOCIN BOLUS FROM INFUSION
500.0000 mL | INTRAVENOUS | Status: DC
  Administered 2014-03-03: 500 mL via INTRAVENOUS

## 2014-03-02 MED ORDER — OXYCODONE-ACETAMINOPHEN 5-325 MG PO TABS: 2.0000 | ORAL_TABLET | ORAL | Status: DC | PRN

## 2014-03-02 MED ORDER — LIDOCAINE HCL (PF) 1 % IJ SOLN
30.0000 mL | INTRAMUSCULAR | Status: DC | PRN
  Filled 2014-03-02: qty 30

## 2014-03-02 MED ORDER — HYDROXYZINE HCL 50 MG PO TABS
50.0000 mg | ORAL_TABLET | Freq: Four times a day (QID) | ORAL | Status: DC | PRN
  Filled 2014-03-02: qty 1

## 2014-03-02 MED ORDER — OXYTOCIN 40 UNITS IN LACTATED RINGERS INFUSION - SIMPLE MED
62.5000 mL/h | INTRAVENOUS | Status: DC
  Filled 2014-03-02: qty 1000

## 2014-03-02 MED ORDER — FLEET ENEMA 7-19 GM/118ML RE ENEM: 1.0000 | ENEMA | RECTAL | Status: DC | PRN

## 2014-03-02 MED ORDER — MISOPROSTOL 200 MCG PO TABS
400.0000 ug | ORAL_TABLET | Freq: Two times a day (BID) | ORAL | Status: DC
  Administered 2014-03-02 – 2014-03-03 (×2): 400 ug via VAGINAL
  Filled 2014-03-02 (×2): qty 2

## 2014-03-02 MED ORDER — LACTATED RINGERS IV SOLN: 500.0000 mL | INTRAVENOUS | Status: DC | PRN

## 2014-03-02 MED ORDER — CYCLOBENZAPRINE HCL 5 MG PO TABS
5.0000 mg | ORAL_TABLET | Freq: Three times a day (TID) | ORAL | Status: DC | PRN
  Filled 2014-03-02: qty 1

## 2014-03-02 MED ORDER — TERBUTALINE SULFATE 1 MG/ML IJ SOLN: 0.2500 mg | Freq: Once | INTRAMUSCULAR | Status: AC | PRN

## 2014-03-02 MED ORDER — CITRIC ACID-SODIUM CITRATE 334-500 MG/5ML PO SOLN: 30.0000 mL | ORAL | Status: DC | PRN

## 2014-03-02 MED ORDER — ONDANSETRON HCL 4 MG/2ML IJ SOLN: 4.0000 mg | Freq: Four times a day (QID) | INTRAMUSCULAR | Status: DC | PRN

## 2014-03-02 NOTE — Progress Notes (Signed)
Discussed inability to determine vertex with provider. He said she was vertex today in office and regardless, he would continue the same plan of care with cytotec

## 2014-03-02 NOTE — H&P (Signed)
Janet Martinez is a 29 y.o. female presenting for IOL for IUFD. Known cystic hygroma with massive ascites, pleural effusions. Has had weekly U/S with MFM. Panorama normal. Subsequent amnio is 71 XX. Today in office U/S showed no FHT. Denies bleeding, leaking, fever, HA or N/V. Maternal Medical History:  Fetal activity: Perceived fetal activity is none.      OB History    Gravida Para Term Preterm AB TAB SAB Ectopic Multiple Living   3 1 1  0 1 0 1 0 0 1     Past Medical History  Diagnosis Date  . History of chicken pox   . Medical history non-contributory    Past Surgical History  Procedure Laterality Date  . Wisdom tooth extraction    . No past surgeries     Family History: family history includes Breast cancer in her other; Cancer in her paternal aunt and paternal grandmother; Mental illness in her sister; Miscarriages / Stillbirths in her mother; Seizures in her brother. Social History:  reports that she has never smoked. She does not have any smokeless tobacco history on file. She reports that she does not drink alcohol or use illicit drugs.   Prenatal Transfer Tool  Maternal Diabetes: No Genetic Screening: Normal Maternal Ultrasounds/Referrals: Abnormal:  Findings:   Other:see above Fetal Ultrasounds or other Referrals:  Referred to Materal Fetal Medicine  Maternal Substance Abuse:  No Significant Maternal Medications:  None Significant Maternal Lab Results:  None Other Comments:  None  Review of Systems  Constitutional: Negative for fever.  Eyes: Negative for blurred vision.  Gastrointestinal: Negative for abdominal pain.  Neurological: Negative for headaches.      Blood pressure 105/69, pulse 78, temperature 97.9 F (36.6 C), temperature source Oral, resp. rate 18, height 5\' 2"  (1.575 m), weight 124 lb (56.246 kg), last menstrual period 09/30/2013. Exam Physical Exam  Cardiovascular: Normal rate and regular rhythm.   Respiratory: Effort normal and breath sounds  normal.  GI: Soft. There is no tenderness.  Neurological: She has normal reflexes.    Prenatal labs: ABO, Rh:   Antibody:   Rubella:   RPR:    HBsAg:    HIV:    GBS:     Assessment/Plan: 30 yo G3P1 with recurrent cystic hygroma and hydrops with IUFD @ 21 6/7 weeks D/W patient and husband cytotec induction-risks reviewed All questions answered. Patient states she understands and agrees   [facility],Rylie Limburg E 03/02/2014, 8:27 PM

## 2014-03-03 ENCOUNTER — Encounter (HOSPITAL_COMMUNITY): Payer: Self-pay | Admitting: *Deleted

## 2014-03-03 ENCOUNTER — Ambulatory Visit (HOSPITAL_COMMUNITY): Payer: 59

## 2014-03-03 MED ORDER — ZOLPIDEM TARTRATE 5 MG PO TABS
5.0000 mg | ORAL_TABLET | Freq: Every evening | ORAL | Status: DC | PRN
  Administered 2014-03-03: 5 mg via ORAL
  Filled 2014-03-03 (×2): qty 1

## 2014-03-03 MED ORDER — MISOPROSTOL 200 MCG PO TABS
800.0000 ug | ORAL_TABLET | Freq: Once | ORAL | Status: DC
  Filled 2014-03-03: qty 4

## 2014-03-03 MED ORDER — FENTANYL 2.5 MCG/ML BUPIVACAINE 1/10 % EPIDURAL INFUSION (WH - ANES): 14.0000 mL/h | INTRAMUSCULAR | Status: DC | PRN

## 2014-03-03 MED ORDER — WITCH HAZEL-GLYCERIN EX PADS
1.0000 "application " | MEDICATED_PAD | CUTANEOUS | Status: DC | PRN
  Administered 2014-03-03: 1 via TOPICAL

## 2014-03-03 MED ORDER — PRENATAL MULTIVITAMIN CH: 1.0000 | ORAL_TABLET | Freq: Every day | ORAL | Status: DC

## 2014-03-03 MED ORDER — FENTANYL CITRATE 0.05 MG/ML IJ SOLN
INTRAMUSCULAR | Status: AC
  Filled 2014-03-03: qty 2

## 2014-03-03 MED ORDER — MISOPROSTOL 200 MCG PO TABS
800.0000 ug | ORAL_TABLET | Freq: Once | ORAL | Status: AC
  Administered 2014-03-03: 800 ug via RECTAL

## 2014-03-03 MED ORDER — MEASLES, MUMPS & RUBELLA VAC ~~LOC~~ INJ: 0.5000 mL | INJECTION | Freq: Once | SUBCUTANEOUS | Status: DC

## 2014-03-03 MED ORDER — BENZOCAINE-MENTHOL 20-0.5 % EX AERO: 1.0000 "application " | INHALATION_SPRAY | CUTANEOUS | Status: DC | PRN

## 2014-03-03 MED ORDER — ONDANSETRON HCL 4 MG PO TABS: 4.0000 mg | ORAL_TABLET | ORAL | Status: DC | PRN

## 2014-03-03 MED ORDER — DIPHENHYDRAMINE HCL 25 MG PO CAPS: 25.0000 mg | ORAL_CAPSULE | Freq: Four times a day (QID) | ORAL | Status: DC | PRN

## 2014-03-03 MED ORDER — DIBUCAINE 1 % RE OINT
1.0000 "application " | TOPICAL_OINTMENT | RECTAL | Status: DC | PRN
  Administered 2014-03-03: 1 via RECTAL
  Filled 2014-03-03: qty 28

## 2014-03-03 MED ORDER — FENTANYL CITRATE 0.05 MG/ML IJ SOLN: 100.0000 ug | Freq: Once | INTRAMUSCULAR | Status: DC

## 2014-03-03 MED ORDER — EPHEDRINE 5 MG/ML INJ
10.0000 mg | INTRAVENOUS | Status: DC | PRN
  Filled 2014-03-03: qty 2

## 2014-03-03 MED ORDER — PROMETHAZINE HCL 25 MG/ML IJ SOLN
12.5000 mg | Freq: Once | INTRAMUSCULAR | Status: AC
  Administered 2014-03-03: 12.5 mg via INTRAVENOUS
  Filled 2014-03-03: qty 1

## 2014-03-03 MED ORDER — ONDANSETRON HCL 4 MG/2ML IJ SOLN: 4.0000 mg | INTRAMUSCULAR | Status: DC | PRN

## 2014-03-03 MED ORDER — BUTORPHANOL TARTRATE 1 MG/ML IJ SOLN: 2.0000 mg | Freq: Once | INTRAMUSCULAR | Status: DC

## 2014-03-03 MED ORDER — CEFAZOLIN SODIUM 1-5 GM-% IV SOLN
1.0000 g | Freq: Once | INTRAVENOUS | Status: AC
  Administered 2014-03-03: 1 g via INTRAVENOUS
  Filled 2014-03-03: qty 50

## 2014-03-03 MED ORDER — LACTATED RINGERS IV SOLN: 500.0000 mL | Freq: Once | INTRAVENOUS | Status: DC

## 2014-03-03 MED ORDER — OXYCODONE-ACETAMINOPHEN 5-325 MG PO TABS
1.0000 | ORAL_TABLET | ORAL | Status: DC | PRN
  Administered 2014-03-03: 1 via ORAL

## 2014-03-03 MED ORDER — DIPHENHYDRAMINE HCL 50 MG/ML IJ SOLN: 12.5000 mg | INTRAMUSCULAR | Status: DC | PRN

## 2014-03-03 MED ORDER — MEDROXYPROGESTERONE ACETATE 150 MG/ML IM SUSP: 150.0000 mg | INTRAMUSCULAR | Status: DC | PRN

## 2014-03-03 MED ORDER — SIMETHICONE 80 MG PO CHEW
80.0000 mg | CHEWABLE_TABLET | ORAL | Status: DC | PRN
Start: 2014-03-03 — End: 2014-03-04

## 2014-03-03 MED ORDER — PHENYLEPHRINE 40 MCG/ML (10ML) SYRINGE FOR IV PUSH (FOR BLOOD PRESSURE SUPPORT)
80.0000 ug | PREFILLED_SYRINGE | INTRAVENOUS | Status: DC | PRN
  Filled 2014-03-03: qty 2

## 2014-03-03 MED ORDER — BISACODYL 10 MG RE SUPP
10.0000 mg | Freq: Every day | RECTAL | Status: DC | PRN
Start: 2014-03-03 — End: 2014-03-04

## 2014-03-03 MED ORDER — OXYCODONE-ACETAMINOPHEN 5-325 MG PO TABS: 2.0000 | ORAL_TABLET | ORAL | Status: DC | PRN

## 2014-03-03 MED ORDER — FLEET ENEMA 7-19 GM/118ML RE ENEM: 1.0000 | ENEMA | Freq: Every day | RECTAL | Status: DC | PRN

## 2014-03-03 MED ORDER — SENNOSIDES-DOCUSATE SODIUM 8.6-50 MG PO TABS
2.0000 | ORAL_TABLET | ORAL | Status: DC
  Administered 2014-03-03: 2 via ORAL
  Filled 2014-03-03: qty 2

## 2014-03-03 MED ORDER — FENTANYL CITRATE 0.05 MG/ML IJ SOLN
100.0000 ug | Freq: Once | INTRAMUSCULAR | Status: AC
Start: 2014-03-03 — End: 2014-03-03
  Administered 2014-03-03: 100 ug via INTRAVENOUS
  Filled 2014-03-03: qty 2

## 2014-03-03 MED ORDER — LANOLIN HYDROUS EX OINT: TOPICAL_OINTMENT | CUTANEOUS | Status: DC | PRN

## 2014-03-03 MED ORDER — IBUPROFEN 600 MG PO TABS
600.0000 mg | ORAL_TABLET | Freq: Four times a day (QID) | ORAL | Status: DC
  Administered 2014-03-03 – 2014-03-04 (×2): 600 mg via ORAL
  Filled 2014-03-03 (×2): qty 1

## 2014-03-03 MED ORDER — TETANUS-DIPHTH-ACELL PERTUSSIS 5-2.5-18.5 LF-MCG/0.5 IM SUSP: 0.5000 mL | Freq: Once | INTRAMUSCULAR | Status: DC

## 2014-03-03 NOTE — Progress Notes (Signed)
Spoke with Chaplain Lumpkin about patient's desire to find a funeral home and needing further information on burial arrangements; Informed a message would be passed along to day shift chaplain to go speak with patient before discharge.

## 2014-03-03 NOTE — Progress Notes (Signed)
Patient feeling slightly dizzy BP low 80s/ 40s Exam Moderate amount of bleeding I was able to remove all of placenta - was in the vagina Vaginal bleeding decreased Patient feels much better Observe and transfer to 3rd floor Parents desire to have Autopsy done for baby with genetic pathologist To test for neuromuscular condition

## 2014-03-03 NOTE — Progress Notes (Signed)
   03/03/14 1100  Clinical Encounter Type  Visited With Patient and family together (pt, husband Audelia Hives, pt's mom Shirlean Mylar and older sister Morey Hummingbird)  Visit Type Follow-up;Spiritual support;Social support  Spiritual Encounters  Spiritual Needs Grief support;Emotional (prayer shawl)  Stress Factors  Patient Stress Factors Loss (hx loss of baby [name] under similar circumstances)  Family Stress Factors Loss (hx loss of baby [name] under similar circumstances)   Followed up with Larena Glassman and Audelia Hives to offer further support, meeting her mom and sister, too.  Family very appreciative of pastoral support and of prayer shawl, a tangible sign of blessing and encouragement.  Working closely with Maryln Manuel, RN to coordinate support.  Pt and family desire bereavement photos of baby and of family members together with baby; RN has consulted with [phone] photographer to begin planning. Spiritual Care will follow closely, but please also page as needs arise.  Thank you.  [place], [place]

## 2014-03-03 NOTE — Progress Notes (Signed)
   03/03/14 1600  Clinical Encounter Type  Visited With Patient and family together (pt, Janet Martinez, pt's parents & sister)  Visit Type Follow-up;Spiritual support;Social support  Spiritual Encounters  Spiritual Needs Grief support;Emotional   Followed up twice this afternoon, providing pastoral presence and bereavement support.  Family has already been well supported by [facility], but provided new "Heartbroken and Bewildered" brochure as additional resource for coping/solidarity/encouragement.  Met pt's dad for the first time, providing pastoral presence and serving as a witness for him as he process his love for his children and grief at their pain and his own loss of baby granddaughter [name].  Family aware of ongoing chaplain availability, but please also page as needs arise.  Thank you.  [place], [place]

## 2014-03-03 NOTE — Progress Notes (Signed)
Patient is status post stadol and is feeling more cramping.  Cervix is tight 1 cm Lower uterine segment is thinning out.  400 mcg Cytotec is placed in posterior fornix  IMPRESSION: IUP at 22 weeks IUFD Fetal anomalies  PLAN: Continue stadol and phenergan prn Continue Cytotec Plan of care reviewed with patient and her spouse

## 2014-03-04 ENCOUNTER — Encounter: Payer: Self-pay | Admitting: Pediatrics

## 2014-03-04 LAB — CBC
HEMATOCRIT: 34 % — AB (ref 36.0–46.0)
Hemoglobin: 11.5 g/dL — ABNORMAL LOW (ref 12.0–15.0)
MCH: 31.9 pg (ref 26.0–34.0)
MCHC: 33.8 g/dL (ref 30.0–36.0)
MCV: 94.2 fL (ref 78.0–100.0)
PLATELETS: 123 10*3/uL — AB (ref 150–400)
RBC: 3.61 MIL/uL — ABNORMAL LOW (ref 3.87–5.11)
RDW: 13.1 % (ref 11.5–15.5)
WBC: 9.3 10*3/uL (ref 4.0–10.5)

## 2014-03-04 LAB — RPR: RPR Ser Ql: NONREACTIVE

## 2014-03-04 MED ORDER — ALPRAZOLAM 0.5 MG PO TABS
ORAL_TABLET | ORAL | Status: AC
  Filled 2014-03-04: qty 1

## 2014-03-04 MED ORDER — ALPRAZOLAM 0.5 MG PO TABS: 0.5000 mg | ORAL_TABLET | Freq: Two times a day (BID) | ORAL | Status: DC | PRN

## 2014-03-04 MED ORDER — ALPRAZOLAM 0.5 MG PO TABS
0.5000 mg | ORAL_TABLET | Freq: Four times a day (QID) | ORAL | Status: DC | PRN
  Administered 2014-03-04: 0.5 mg via ORAL

## 2014-03-04 NOTE — Progress Notes (Unsigned)
Patient ID: Janet Martinez, female   DOB: [birth year], 29 y.o.   MRN: [id number] MEDICAL GENETICS CONSULTATION  Request by Genetic Counselor, Maternal Fetal Medicine Program, to talk with Mr. And Mrs. Schedler during the admission and to determine my impression of any potential for a diagnosis given the previous early loss of a female infant.   The exam shows a female fetus with large cystic hygroma and extensive edema.  There is not other pterygia. There does not appear to be syndactyly.  There is no polydactyly. There were not obvious contractures.   Photographs were taken for the parents by professional photographer.   Diagnostic considerations include Turner syndrome (the parents have related that the whole genomic microarray was negative and NIPS negative). I have collected an achilles ligament sample from the fetus for fibroblast growth per parent request and will have the [facility] cytogenetics lab determine the karyotype.  This will be on approach to determine if there is aneuploidy for the X chromosome or possibly mosaicism for X chromosome aneuploidy.  A sample could be saved.  It is not clear whether this represents a familial neurogenic condition associated with fetal loss. Turner syndrome should first be considered.   They have named the infant Janet Martinez.     York Grice, M.D., Ph.D. Clinical Professor, Pediatrics and Medical Genetics{misc; consult options:15850:a}

## 2014-03-04 NOTE — Progress Notes (Signed)
Ur chart review completed.  

## 2014-03-04 NOTE — Progress Notes (Signed)
I offered continued bereavement support to [place].  I worked with staff to help provide some additional mementos that they wanted (hospital bracelets, and certificate of life).  I spoke with them at length about funeral arrangements and their plans.  They have not made any decisions about funeral homes at this point, but I gave them the phone number for [facility] at [facility]. They are aware of follow up support availability from [place] as well as from [place].    [employer] Pager, [phone] 12:09 PM    03/04/14 1200  Clinical Encounter Type  Visited With Patient and family together  Visit Type Spiritual support  Referral From Chaplain  Spiritual Encounters  Spiritual Needs Emotional;Grief support

## 2014-03-04 NOTE — Discharge Summary (Signed)
Physician Discharge Summary  Patient ID: Janet Martinez MRN: [id number] DOB/AGE: [birth year] 29 y.o.  Admit date: 03/02/2014 Discharge date: 03/04/2014  Admission Diagnoses:[redacted]w[redacted]d, IUFD, fetal anomalies   Discharge Diagnoses: same Active Problems:   IUFD (intrauterine fetal death)   Discharged Condition: good  Hospital Course: adm for cytotec induction for IUFD, del spont with intact placenta, adm overnight for obsv, no further bleeding  Consults: chaplain service  Significant Diagnostic Studies: labs:  CBC    Component Value Date/Time   WBC 9.3 03/04/2014 0536   RBC 3.61* 03/04/2014 0536   HGB 11.5* 03/04/2014 0536   HCT 34.0* 03/04/2014 0536   PLT 123* 03/04/2014 0536   MCV 94.2 03/04/2014 0536   MCH 31.9 03/04/2014 0536   MCHC 33.8 03/04/2014 0536   RDW 13.1 03/04/2014 0536      Treatments: cytotec induction  Discharge Exam: Blood pressure 101/60, pulse 63, temperature 98.2 F (36.8 C), temperature source Oral, resp. rate 16, height 5\' 2"  (1.575 m), weight 124 lb (56.246 kg), last menstrual period 09/30/2013, SpO2 99 %. fundus firm, nontender  Disposition: 01-Home or Self Care     Medication List    TAKE these medications        ALPRAZolam 0.5 MG tablet  Commonly known as:  XANAX  Take 1 tablet (0.5 mg total) by mouth 2 (two) times daily as needed for anxiety.     cyclobenzaprine 5 MG tablet  Commonly known as:  FLEXERIL  Take 1 tablet (5 mg total) by mouth every 8 (eight) hours as needed for muscle spasms.     prenatal multivitamin Tabs tablet  Take 1 tablet by mouth at bedtime.           Follow-up Information    Follow up with Margarette Asal, MD In 1 week.   Specialty:  Obstetrics and Gynecology   Why:  office will call   Contact information:   [place] [address] [place] [address] [phone]       Signed: Margarette Asal 03/04/2014, 7:46 AM

## 2014-03-04 NOTE — Progress Notes (Signed)
Pt tearful  Out with husband  In wheelchair  Pt will call back with funeral  Home name

## 2014-03-10 ENCOUNTER — Ambulatory Visit (HOSPITAL_COMMUNITY): Payer: 59

## 2014-03-23 LAB — TISSUE HYBRIDIZATION TO NCBH

## 2014-03-23 LAB — CHROMOSOME STD, POC(TISSUE)-NCBH

## 2014-03-25 ENCOUNTER — Telehealth (HOSPITAL_COMMUNITY): Payer: Self-pay | Admitting: Genetics

## 2014-03-25 NOTE — Telephone Encounter (Addendum)
Called Ms. Clonch to review the results of the chromosome analysis performed after the loss of their recent pregnancy. She called me right back after I left a voice mail.  She reported that she is recovering ok physically, but emotionally having a tough time.  She reports that a very close friend had a baby girl this week and it brings up a lot of feelings for her.  She reported that her husband is having a very hard time but that he has started counseling and seems to be doing better.  She states that it is hard for her to care for their daughter, her husband and herself.  She reports that she is not back at work yet.  She states she is taking Zoloft and feels that it is helping.  Ms. Hewes reported that she has a great support system and that feels that she has people she can talk to and lean on for support.   We discussed that after the delivery of their daughter, [name], that Dr. Abelina Bachelor, a medical geneticist, examined [name].  She did not note any evidence of webbing of the joints or between the fingers, she also did not note any contractures.  Physically, the only differences she reported were the cystic hygroma and significant edema.  Dr. Abelina Bachelor did take a piece of ligament from [name]'s heel to send for chromosome analysis.  Mrs. Bry thought that [name]'s muscles were going to be examined, but I shared with her that it was not done by Dr. Abelina Bachelor, and that I could not find evidence of another type of autopsy being performed which may have evaluated that.  However, we discussed that it could be that the report was not complete or that Dr. Julien Girt may have more information.  We discussed that the postnatal sample from the heel did not result in any viable cells for metaphase analysis, for this reason only FISH analysis was performed.  We discussed that this analysis identified primarily normal female cells as was seen in the amniocentesis, but also identified 15% of the cells that were missing the 2nd X chromosome,  typical of mosaic Turner syndrome.  Mrs. Tuch was understandably upset by this finding.  She was not expecting that the chromosomes were going to be examined again, and certainly not going to identify something that we had already expected was ruled out (with the normal chromosomes and microarray from amnio).  We discussed Turner syndrome and mosaicism.  We discussed that the cells can appear one way in one tissue and a different way in another tissue type.  She was counseled that each of these analyses is likely to be accurate, as they were looking at different tissues, there was a different proportion of abnormal cells, and some tissues apparently had all normal cells.    Given that their first son, [name], had normal chromosomes and was female, we know that he did not have Turner syndrome.  Additionally, we know the recurrence chance for Turner syndrome is not increased after a pregnancy with Turner syndrome.  It is possible that [name] had two conditions, both Mosaic Turner syndrome and the same condition that [place] had.  It is also possible that [name] only had Turner syndrome.  It is possible that the finding of mosaicism on the postmortem analysis was incidental and not related to her cystic hygroma and edema, but that instead [name] had the same condition that [place] had. All of these different possibilities were very hard for Mrs. Apostol to understand  and accept, in part because we have no evidence to tell her which one is most likely.    I offered for Mrs. Membreno to come in and discuss all of these findings now, or at later date.  I offered for her to meet with me or a different genetic counselor.  I offered to facilitate a pan-ethnic carrier screen by testing a panel ([facility]) of many genetic conditions.  We discussed that this carrier screening could be performed at our office or at her Ob's office.  We discussed that this carrier testing could determine that she is a carrier for a condition completely unrelated to  the features seen in her children, or be completely normal.  She understood all of these options and was undecided as to what she wanted her next step to be.  She has all of my contact information and will let me know what we can do for her.  All of her questions were answered to her satisfaction.

## 2014-03-26 ENCOUNTER — Other Ambulatory Visit (HOSPITAL_COMMUNITY): Payer: Self-pay

## 2014-04-01 ENCOUNTER — Encounter: Payer: Self-pay | Admitting: Family Medicine

## 2014-04-01 ENCOUNTER — Ambulatory Visit (INDEPENDENT_AMBULATORY_CARE_PROVIDER_SITE_OTHER): Payer: 59 | Admitting: Family Medicine

## 2014-04-01 VITALS — BP 128/70 | Wt 125.0 lb

## 2014-04-01 DIAGNOSIS — M62838 Other muscle spasm: Secondary | ICD-10-CM

## 2014-04-01 DIAGNOSIS — M999 Biomechanical lesion, unspecified: Secondary | ICD-10-CM

## 2014-04-01 DIAGNOSIS — M6248 Contracture of muscle, other site: Secondary | ICD-10-CM

## 2014-04-01 DIAGNOSIS — M9902 Segmental and somatic dysfunction of thoracic region: Secondary | ICD-10-CM

## 2014-04-01 DIAGNOSIS — M9903 Segmental and somatic dysfunction of lumbar region: Secondary | ICD-10-CM

## 2014-04-01 DIAGNOSIS — M9901 Segmental and somatic dysfunction of cervical region: Secondary | ICD-10-CM

## 2014-04-01 MED ORDER — CYCLOBENZAPRINE HCL 10 MG PO TABS: 10.0000 mg | ORAL_TABLET | Freq: Three times a day (TID) | ORAL | Status: DC | PRN

## 2014-04-01 NOTE — Assessment & Plan Note (Signed)
Decision today to treat with OMT was based on Physical Exam  After verbal consent patient was treated with  ME, FPR, soft tissue techniques in cervical, thoracic, rib,  thoracic areas  Patient tolerated the procedure well with improvement in symptoms  Patient given exercises, stretches and lifestyle modifications  See medications in patient instructions if given  Patient will follow up in 2-3 weeks

## 2014-04-01 NOTE — Patient Instructions (Signed)
Good to see you Ice 20 minutes 2 times daily.  Ibuprofen 3 times a day for 6 days.  Flexeril at night See me again in 3 weeks.

## 2014-04-01 NOTE — Progress Notes (Signed)
   CC: Neck pain follow up.    HPI: Patient is a very pleasant 29 year old female coming in with neck pain.  He has had flares previously. Patient is having worsening symptoms again. Patient was trying to lift her daughter and unfortunately had severe pain in the cervical region. Patient states that it is very tight. Patient did take a muscle relaxer which was somewhat helpful. Patient denies any radiation into the hands or any numbness or detailing. Patient has been's more stress than usual.   Past medical, surgical, family and social history reviewed. Medications reviewed all in the electronic medical record.   Review of Systems: No headache, visual changes, nausea, vomiting, diarrhea, constipation, dizziness, abdominal pain, skin rash, fevers, chills, night sweats, weight loss, swollen lymph nodes, body aches, joint swelling, muscle aches, chest pain, shortness of breath, mood changes.   Objective:    Blood pressure 128/70, weight 125 lb (56.7 kg).   General: No apparent distress alert and oriented x3 mood and affect normal, dressed appropriately.  HEENT: Pupils equal, extraocular movements intact Respiratory: Patient's speak in full sentences and does not appear short of breath Cardiovascular: No lower extremity edema, non tender, no erythema Skin: Warm dry intact with no signs of infection or rash on extremities or on axial skeleton. Abdomen: Soft nontender not gravid Neuro: Cranial nerves II through XII are intact, neurovascularly intact in all extremities with 2+ DTRs and 2+ pulses. Lymph: No lymphadenopathy of posterior or anterior cervical chain or axillae bilaterally.  Gait normal with good balance and coordination.  MSK: Non tender with full range of motion and good stability and symmetric strength and tone of shoulders, elbows, wrist, hip, knee and ankles bilaterally.  Neck: Inspection unremarkable. No palpable stepoffs. Negative Spurling's maneuver. Decreased range of motion  in flexion-extension as well as side bending bilaterally. Grip strength and sensation normal in bilateral hands Strength good C4 to T1 distribution No sensory change to C4 to T1 Negative Hoffman sign bilaterally Reflexes normal Difficult muscle tightness of the cervical region as well as the thoracic region.  OMT Physical Exam  Cervical  C2 extended rotated and side bent left C4 flexed rotated and side bent left   Thoracic  T2 extended rotated and side bent right with inhaled second rib T5 extended rotated and side bent left    Impression and Recommendations:     This case required medical decision making of moderate complexity.

## 2014-04-01 NOTE — Progress Notes (Signed)
Pre visit review using our clinic review tool, if applicable. No additional management support is needed unless otherwise documented below in the visit note. 

## 2014-04-01 NOTE — Assessment & Plan Note (Signed)
Patient does have more of a spasm. Patient given a refill of Flexeril which I think a be beneficial. We discussed icing regimen. Patient will start home exercises again. Think patient will improve fairly quickly. Patient will come back again in 2-3 weeks for further evaluation and treatment.

## 2014-04-22 ENCOUNTER — Ambulatory Visit (INDEPENDENT_AMBULATORY_CARE_PROVIDER_SITE_OTHER): Payer: 59 | Admitting: Family Medicine

## 2014-04-22 ENCOUNTER — Encounter: Payer: Self-pay | Admitting: Family Medicine

## 2014-04-22 VITALS — BP 112/78 | HR 128 | Ht 62.0 in

## 2014-04-22 DIAGNOSIS — M9903 Segmental and somatic dysfunction of lumbar region: Secondary | ICD-10-CM

## 2014-04-22 DIAGNOSIS — M9901 Segmental and somatic dysfunction of cervical region: Secondary | ICD-10-CM

## 2014-04-22 DIAGNOSIS — M9902 Segmental and somatic dysfunction of thoracic region: Secondary | ICD-10-CM

## 2014-04-22 DIAGNOSIS — M62838 Other muscle spasm: Secondary | ICD-10-CM

## 2014-04-22 DIAGNOSIS — M6248 Contracture of muscle, other site: Secondary | ICD-10-CM

## 2014-04-22 DIAGNOSIS — M999 Biomechanical lesion, unspecified: Secondary | ICD-10-CM

## 2014-04-22 NOTE — Progress Notes (Signed)
Pre visit review using our clinic review tool, if applicable. No additional management support is needed unless otherwise documented below in the visit note. 

## 2014-04-22 NOTE — Assessment & Plan Note (Signed)
Isn't neck spasm seems to be significant improved. Patient does have anti-inflammatories and will be given muscle relaxers to have on hand. We discussed icing regimen as well as the postural changes that continues to seem to make some improvement. His lungs patient continues to improve we'll start spacing patient out on her office visits. Patient come back in 4 weeks for further evaluation and treatment.

## 2014-04-22 NOTE — Progress Notes (Signed)
  CC: Neck pain follow up.    HPI: Patient is a very pleasant 29 year old female coming in with neck pain.  He has had flares previously. Patient states that overall she is feeling somewhat better. Patient denies any radiation of pain. States that still is the upper thoracic spine in the neck that seems to be the worse. Denies any tingling or radiation down the arms.   Past medical, surgical, family and social history reviewed. Medications reviewed all in the electronic medical record.   Review of Systems: No headache, visual changes, nausea, vomiting, diarrhea, constipation, dizziness, abdominal pain, skin rash, fevers, chills, night sweats, weight loss, swollen lymph nodes, body aches, joint swelling, muscle aches, chest pain, shortness of breath, mood changes.   Objective:    Blood pressure 112/78, pulse 128, height 5\' 2"  (1.575 m), SpO2 96 %.   General: No apparent distress alert and oriented x3 mood and affect normal, dressed appropriately.  HEENT: Pupils equal, extraocular movements intact Respiratory: Patient's speak in full sentences and does not appear short of breath Cardiovascular: No lower extremity edema, non tender, no erythema Skin: Warm dry intact with no signs of infection or rash on extremities or on axial skeleton. Abdomen: Soft nontender not gravid Neuro: Cranial nerves II through XII are intact, neurovascularly intact in all extremities with 2+ DTRs and 2+ pulses. Lymph: No lymphadenopathy of posterior or anterior cervical chain or axillae bilaterally.  Gait normal with good balance and coordination.  MSK: Non tender with full range of motion and good stability and symmetric strength and tone of shoulders, elbows, wrist, hip, knee and ankles bilaterally.  Neck: Inspection unremarkable. No palpable stepoffs. Negative Spurling's maneuver. Increased rotation as well as flexion and extension compared to previous exam Grip strength and sensation normal in bilateral  hands Strength good C4 to T1 distribution No sensory change to C4 to T1 Negative Hoffman sign bilaterally Reflexes normal Still some muscle tightness of the paraspinal musculature  OMT Physical Exam  Cervical  C2 extended rotated and side bent left C4 flexed rotated and side bent left   Thoracic  T2 extended rotated and side bent right with inhaled second rib T5 extended rotated and side bent left    Impression and Recommendations:     This case required medical decision making of moderate complexity.

## 2014-04-22 NOTE — Patient Instructions (Addendum)
Good to se eyou Thanks for bringin your bodygaurd Keep up what you are doing Consider keeping a journal See me in 4 weeks.

## 2014-04-22 NOTE — Assessment & Plan Note (Signed)
Decision today to treat with OMT was based on Physical Exam  After verbal consent patient was treated with  ME, FPR, soft tissue techniques in cervical, thoracic, rib,  thoracic areas  Patient tolerated the procedure well with improvement in symptoms  Patient given exercises, stretches and lifestyle modifications  See medications in patient instructions if given  Patient will follow up in4  weeks

## 2014-05-20 ENCOUNTER — Ambulatory Visit: Payer: 59 | Admitting: Family Medicine

## 2014-06-01 ENCOUNTER — Other Ambulatory Visit (HOSPITAL_COMMUNITY): Payer: Self-pay | Admitting: Maternal and Fetal Medicine

## 2014-07-06 ENCOUNTER — Encounter: Payer: Self-pay | Admitting: Family Medicine

## 2014-07-06 ENCOUNTER — Ambulatory Visit (INDEPENDENT_AMBULATORY_CARE_PROVIDER_SITE_OTHER): Payer: 59 | Admitting: Family Medicine

## 2014-07-06 VITALS — BP 102/70 | HR 69 | Ht 62.0 in | Wt 125.0 lb

## 2014-07-06 DIAGNOSIS — M9902 Segmental and somatic dysfunction of thoracic region: Secondary | ICD-10-CM | POA: Diagnosis not present

## 2014-07-06 DIAGNOSIS — M6248 Contracture of muscle, other site: Secondary | ICD-10-CM

## 2014-07-06 DIAGNOSIS — M9901 Segmental and somatic dysfunction of cervical region: Secondary | ICD-10-CM

## 2014-07-06 DIAGNOSIS — M9903 Segmental and somatic dysfunction of lumbar region: Secondary | ICD-10-CM | POA: Diagnosis not present

## 2014-07-06 DIAGNOSIS — M999 Biomechanical lesion, unspecified: Secondary | ICD-10-CM

## 2014-07-06 DIAGNOSIS — M9908 Segmental and somatic dysfunction of rib cage: Secondary | ICD-10-CM

## 2014-07-06 DIAGNOSIS — M62838 Other muscle spasm: Secondary | ICD-10-CM

## 2014-07-06 MED ORDER — CYCLOBENZAPRINE HCL 10 MG PO TABS: 10.0000 mg | ORAL_TABLET | Freq: Three times a day (TID) | ORAL | Status: DC | PRN

## 2014-07-06 MED ORDER — GABAPENTIN 100 MG PO CAPS: 100.0000 mg | ORAL_CAPSULE | Freq: Every day | ORAL | Status: DC

## 2014-07-06 NOTE — Assessment & Plan Note (Signed)
Patient is having another neck spasm. I do think that this is multifactorial. There is a possibility that this is secondary to migraines. We discussed icing regimen, home exercises, we discussed postural changes. We discussed how patient's needs to work more on posture changes.Patient will try to make these changes and given medications as per patient instructions. Patient will come back and see me again in 2 weeks. At that time we'll try to do more manipulation as well as see if we can find work reasoning for why this is occurring.

## 2014-07-06 NOTE — Progress Notes (Signed)
  CC: Neck pain follow up.    HPI: Patient is a very pleasant 29 year old female coming in with neck pain.  He has had flares previously. Patient is having another flare at this time. Patient does not remember changing anything. Patient was sitting on floor but does not think that this was causing her. Tightness of the neck bilaterally right greater than left. Denies radiation down the arm. Has had further workup for this previously. States that he feels somewhat similar but may be worse than usual.   Past medical, surgical, family and social history reviewed. Medications reviewed all in the electronic medical record.   Review of Systems: No headache, visual changes, nausea, vomiting, diarrhea, constipation, dizziness, abdominal pain, skin rash, fevers, chills, night sweats, weight loss, swollen lymph nodes, body aches, joint swelling, muscle aches, chest pain, shortness of breath, mood changes.   Objective:    Blood pressure 102/70, pulse 69, height 5\' 2"  (1.575 m), weight 125 lb (56.7 kg), SpO2 99 %.   General: No apparent distress alert and oriented x3 mood and affect normal, dressed appropriately.  HEENT: Pupils equal, extraocular movements intact Respiratory: Patient's speak in full sentences and does not appear short of breath Cardiovascular: No lower extremity edema, non tender, no erythema Skin: Warm dry intact with no signs of infection or rash on extremities or on axial skeleton. Abdomen: Soft nontender not gravid Neuro: Cranial nerves II through XII are intact, neurovascularly intact in all extremities with 2+ DTRs and 2+ pulses. Lymph: No lymphadenopathy of posterior or anterior cervical chain or axillae bilaterally.  Gait normal with good balance and coordination.  MSK: Non tender with full range of motion and good stability and symmetric strength and tone of shoulders, elbows, wrist, hip, knee and ankles bilaterally.  Neck: Inspection unremarkable.tightness with decrease in  lordosis. No palpable stepoffs. Negative Spurling's maneuver. Decreased side bending and rotation bilaterally right greater than left by at least 10 degrees in every plane Grip strength and sensation normal in bilateral hands Strength good C4 to T1 distribution No sensory change to C4 to T1 Negative Hoffman sign bilaterally Reflexes normal severe muscle tightness of the paraspinal musculature  OMT Physical Exam  Cervical  C2 extended rotated and side bent left C4 flexed rotated and side bent left   Thoracic  T2 extended rotated and side bent right with inhaled second rib T5 extended rotated and side bent left    Impression and Recommendations:     This case required medical decision making of moderate complexity.

## 2014-07-06 NOTE — Progress Notes (Signed)
Pre visit review using our clinic review tool, if applicable. No additional management support is needed unless otherwise documented below in the visit note. 

## 2014-07-06 NOTE — Assessment & Plan Note (Signed)
Decision today to treat with OMT was based on Physical Exam  After verbal consent patient was treated with  ME, FPR, soft tissue techniques in cervical, thoracic, rib,  thoracic areas  Patient tolerated the procedure well with improvement in symptoms  Patient given exercises, stretches and lifestyle modifications  See medications in patient instructions if given  Patient will follow up in 2 weeks

## 2014-07-06 NOTE — Patient Instructions (Addendum)
Good to see you as always.  Try medicine 3 times a day for 6 days Muscle relaxer as needed up to 3 times a day Ice is your friend See me again in 2 weeks.

## 2014-07-22 ENCOUNTER — Encounter: Payer: Self-pay | Admitting: Family Medicine

## 2014-07-22 ENCOUNTER — Ambulatory Visit (INDEPENDENT_AMBULATORY_CARE_PROVIDER_SITE_OTHER): Payer: 59 | Admitting: Family Medicine

## 2014-07-22 VITALS — BP 118/80 | HR 88 | Ht 62.0 in | Wt 125.0 lb

## 2014-07-22 DIAGNOSIS — M999 Biomechanical lesion, unspecified: Secondary | ICD-10-CM

## 2014-07-22 DIAGNOSIS — M62838 Other muscle spasm: Secondary | ICD-10-CM

## 2014-07-22 DIAGNOSIS — M6248 Contracture of muscle, other site: Secondary | ICD-10-CM | POA: Diagnosis not present

## 2014-07-22 DIAGNOSIS — M9908 Segmental and somatic dysfunction of rib cage: Secondary | ICD-10-CM

## 2014-07-22 DIAGNOSIS — M9902 Segmental and somatic dysfunction of thoracic region: Secondary | ICD-10-CM

## 2014-07-22 DIAGNOSIS — M9901 Segmental and somatic dysfunction of cervical region: Secondary | ICD-10-CM

## 2014-07-22 NOTE — Patient Instructions (Addendum)
Good to see you Ice is your friend Continue the exercises Overall I am impressed Message is good.  See me again in 4 weeks.

## 2014-07-22 NOTE — Assessment & Plan Note (Signed)
Decision today to treat with OMT was based on Physical Exam  After verbal consent patient was treated with  ME, FPR, soft tissue techniques in cervical, thoracic, rib,  thoracic areas  Patient tolerated the procedure well with improvement in symptoms  Patient given exercises, stretches and lifestyle modifications  See medications in patient instructions if given  Patient will follow up in 4 weeks

## 2014-07-22 NOTE — Assessment & Plan Note (Signed)
Patient is doing much better at this time. Encourage patient to continue the exercises on a regular basis. We discussed icing regimen and home exercises. Patient and will continue to work on scapular strengthening to help with stabilization. Patient and will come back and see me again in 4 weeks for further evaluation and treatment.

## 2014-07-22 NOTE — Progress Notes (Signed)
  CC: Neck pain follow up.    HPI: Patient is a very pleasant 29 year old female coming in with neck pain.  Overall patient has been doing relatively well. Patient denies any worsening of any significant symptoms. Patient did have some mild stress at work the other day. Patient is only use the muscle relaxer one time since last visit.   Past medical, surgical, family and social history reviewed. Medications reviewed all in the electronic medical record.   Review of Systems: No headache, visual changes, nausea, vomiting, diarrhea, constipation, dizziness, abdominal pain, skin rash, fevers, chills, night sweats, weight loss, swollen lymph nodes, body aches, joint swelling, muscle aches, chest pain, shortness of breath, mood changes.   Objective:    Blood pressure 118/80, pulse 88, height 5\' 2"  (1.575 m), weight 125 lb (56.7 kg), SpO2 99 %.   General: No apparent distress alert and oriented x3 mood and affect normal, dressed appropriately.  HEENT: Pupils equal, extraocular movements intact Respiratory: Patient's speak in full sentences and does not appear short of breath Cardiovascular: No lower extremity edema, non tender, no erythema Skin: Warm dry intact with no signs of infection or rash on extremities or on axial skeleton. Abdomen: Soft nontender not gravid Neuro: Cranial nerves II through XII are intact, neurovascularly intact in all extremities with 2+ DTRs and 2+ pulses. Lymph: No lymphadenopathy of posterior or anterior cervical chain or axillae bilaterally.  Gait normal with good balance and coordination.  MSK: Non tender with full range of motion and good stability and symmetric strength and tone of shoulders, elbows, wrist, hip, knee and ankles bilaterally.  Neck: Inspection unremarkableNo palpable stepoffs. Negative Spurling's maneuver. Full range of motion Grip strength and sensation normal in bilateral hands Strength good C4 to T1 distribution No sensory change to C4 to  T1 Negative Hoffman sign bilaterally Reflexes normal Minimal tenderness of the paraspinal musculature still present  OMT Physical Exam  Cervical  C2 extended rotated and side bent left C4 flexed rotated and side bent left   Thoracic  T2 extended rotated and side bent right with inhaled second rib T5 extended rotated and side bent left    Impression and Recommendations:     This case required medical decision making of moderate complexity.

## 2014-07-22 NOTE — Progress Notes (Signed)
Pre visit review using our clinic review tool, if applicable. No additional management support is needed unless otherwise documented below in the visit note. 

## 2014-07-28 ENCOUNTER — Encounter: Payer: Self-pay | Admitting: Family

## 2014-07-28 ENCOUNTER — Ambulatory Visit (INDEPENDENT_AMBULATORY_CARE_PROVIDER_SITE_OTHER): Payer: 59 | Admitting: Family

## 2014-07-28 VITALS — BP 114/80 | HR 72 | Temp 98.2°F | Resp 18 | Ht 62.0 in | Wt 124.0 lb

## 2014-07-28 DIAGNOSIS — J069 Acute upper respiratory infection, unspecified: Secondary | ICD-10-CM | POA: Diagnosis not present

## 2014-07-28 MED ORDER — BENZONATATE 100 MG PO CAPS: 100.0000 mg | ORAL_CAPSULE | Freq: Two times a day (BID) | ORAL | Status: DC | PRN

## 2014-07-28 MED ORDER — AZITHROMYCIN 250 MG PO TABS: ORAL_TABLET | ORAL | Status: DC

## 2014-07-28 NOTE — Progress Notes (Signed)
Pre visit review using our clinic review tool, if applicable. No additional management support is needed unless otherwise documented below in the visit note. 

## 2014-07-28 NOTE — Progress Notes (Signed)
Subjective:    Patient ID: Janet Martinez, female    DOB: November 16, 1985, 29 y.o.   MRN: [id number]  Chief Complaint  Patient presents with  . Cough    x1 week, body aches, fatigue, felt dizzy at work, congested, cough, and drainage    HPI:  Janet Martinez is a 29 y.o. female with a PMH of sinusitis and otitis media who presents today for an acute office visit.    Associated symptoms of body aches, dizziness, congestion, cough and drainage for about 1 week. Modifying factors include Mucinex and tried leftover Ceftin from a previous prescription which neither have helped very much. Symptoms are constant throughout the day. Over the course of the week symptoms have remained about the same with a possible minor improvement.    No Known Allergies  Current Outpatient Prescriptions on File Prior to Visit  Medication Sig Dispense Refill  . ALPRAZolam (XANAX) 0.5 MG tablet Take 1 tablet (0.5 mg total) by mouth 2 (two) times daily as needed for anxiety. 30 tablet 0  . cyclobenzaprine (FLEXERIL) 10 MG tablet Take 1 tablet (10 mg total) by mouth 3 (three) times daily as needed for muscle spasms. 30 tablet 1  . gabapentin (NEURONTIN) 100 MG capsule Take 1 capsule (100 mg total) by mouth at bedtime. 30 capsule 3  . LO LOESTRIN FE 1 MG-10 MCG / 10 MCG tablet   12  . Prenatal Vit-Fe Fumarate-FA (PRENATAL MULTIVITAMIN) TABS Take 1 tablet by mouth at bedtime.      . sertraline (ZOLOFT) 50 MG tablet   12  . temazepam (RESTORIL) 30 MG capsule   0   No current facility-administered medications on file prior to visit.     Review of Systems  Constitutional: Positive for fever.  HENT: Positive for congestion. Negative for sinus pressure.   Respiratory: Positive for cough.       Objective:    BP 114/80 mmHg  Pulse 72  Temp(Src) 98.2 F (36.8 C) (Oral)  Resp 18  Ht 5\' 2"  (1.575 m)  Wt 124 lb (56.246 kg)  BMI 22.67 kg/m2  SpO2 99% Nursing note and vital signs reviewed.  Physical Exam    Constitutional: She is oriented to person, place, and time. She appears well-developed and well-nourished. No distress.  HENT:  Right Ear: Hearing, tympanic membrane, external ear and ear canal normal.  Left Ear: Hearing, tympanic membrane, external ear and ear canal normal.  Nose: Right sinus exhibits no maxillary sinus tenderness and no frontal sinus tenderness. Left sinus exhibits no maxillary sinus tenderness and no frontal sinus tenderness.  Mouth/Throat: Uvula is midline, oropharynx is clear and moist and mucous membranes are normal.  Cardiovascular: Normal rate, regular rhythm, normal heart sounds and intact distal pulses.   Pulmonary/Chest: Effort normal and breath sounds normal.  Neurological: She is alert and oriented to person, place, and time.  Skin: Skin is warm and dry.  Psychiatric: She has a normal mood and affect. Her behavior is normal. Judgment and thought content normal.       Assessment & Plan:   Problem List Items Addressed This Visit      Respiratory   Acute upper respiratory infection - Primary    Symptoms and exam consistent with upper respiratory infection. Start azithromycin. Start Tessalon as needed for cough. Continue over-the-counter medications as needed for symptom relief and supportive care. Follow-up if symptoms worsen or fail to improve.      Relevant Medications   azithromycin (ZITHROMAX) 250 MG tablet  benzonatate (TESSALON) 100 MG capsule

## 2014-07-28 NOTE — Assessment & Plan Note (Signed)
Symptoms and exam consistent with upper respiratory infection. Start azithromycin. Start Tessalon as needed for cough. Continue over-the-counter medications as needed for symptom relief and supportive care. Follow-up if symptoms worsen or fail to improve.

## 2014-07-28 NOTE — Patient Instructions (Addendum)
Thank you for choosing [facility].  Summary/Instructions:  Your prescription(s) have been submitted to your pharmacy or been printed and provided for you. Please take as directed and contact our office if you believe you are having problem(s) with the medication(s) or have any questions.  If your symptoms worsen or fail to improve, please contact our office for further instruction, or in case of emergency go directly to the emergency room at the closest medical facility.   General Recommendations:    Please drink plenty of fluids.  Get plenty of rest   Sleep in humidified air  Use saline nasal sprays  Netti pot   OTC Medications:  Decongestants - helps relieve congestion   Flonase (generic fluticasone) or Nasacort (generic triamcinolone) - please make sure to use the "cross-over" technique at a 45 degree angle towards the opposite eye as opposed to straight up the nasal passageway.   Sudafed (generic pseudoephedrine - Note this is the one that is available behind the pharmacy counter); Products with phenylephrine (-PE) may also be used but is often not as effective as pseudoephedrine.   If you have HIGH BLOOD PRESSURE - Coricidin HBP; AVOID any product that is -D as this contains pseudoephedrine which may increase your blood pressure.  Afrin (oxymetazoline) every 6-8 hours for up to 3 days.   Allergies - helps relieve runny nose, itchy eyes and sneezing   Claritin (generic loratidine), Allegra (fexofenidine), or Zyrtec (generic cyrterizine) for runny nose. These medications should not cause drowsiness.  Note - Benadryl (generic diphenhydramine) may be used however may cause drowsiness  Cough -   Delsym or Robitussin (generic dextromethorphan)  Expectorants - helps loosen mucus to ease removal   Mucinex (generic guaifenesin) as directed on the package.  Headaches / General Aches   Tylenol (generic acetaminophen) - DO NOT EXCEED 3 grams (3,000 mg) in a 24  hour time period  Advil/Motrin (generic ibuprofen)   Sore Throat -   Salt water gargle   Chloraseptic (generic benzocaine) spray or lozenges / Sucrets (generic dyclonine)        Upper Respiratory Infection, Adult An upper respiratory infection (URI) is also sometimes known as the common cold. The upper respiratory tract includes the nose, sinuses, throat, trachea, and bronchi. Bronchi are the airways leading to the lungs. Most people improve within 1 week, but symptoms can last up to 2 weeks. A residual cough may last even longer.  CAUSES Many different viruses can infect the tissues lining the upper respiratory tract. The tissues become irritated and inflamed and often become very moist. Mucus production is also common. A cold is contagious. You can easily spread the virus to others by oral contact. This includes kissing, sharing a glass, coughing, or sneezing. Touching your mouth or nose and then touching a surface, which is then touched by another person, can also spread the virus. SYMPTOMS  Symptoms typically develop 1 to 3 days after you come in contact with a cold virus. Symptoms vary from person to person. They may include: 4. Runny nose. 5. Sneezing. 6. Nasal congestion. 7. Sinus irritation. 8. Sore throat. 9. Loss of voice (laryngitis). 10. Cough. 11. Fatigue. 12. Muscle aches. 13. Loss of appetite. 14. Headache. 15. Low-grade fever. DIAGNOSIS  You might diagnose your own cold based on familiar symptoms, since most people get a cold 2 to 3 times a year. Your caregiver can confirm this based on your exam. Most importantly, your caregiver can check that your symptoms are not due to   another disease such as strep throat, sinusitis, pneumonia, asthma, or epiglottitis. Blood tests, throat tests, and X-rays are not necessary to diagnose a common cold, but they may sometimes be helpful in excluding other more serious diseases. Your caregiver will decide if any further tests are  required. RISKS AND COMPLICATIONS  You may be at risk for a more severe case of the common cold if you smoke cigarettes, have chronic heart disease (such as heart failure) or lung disease (such as asthma), or if you have a weakened immune system. The very young and very old are also at risk for more serious infections. Bacterial sinusitis, middle ear infections, and bacterial pneumonia can complicate the common cold. The common cold can worsen asthma and chronic obstructive pulmonary disease (COPD). Sometimes, these complications can require emergency medical care and may be life-threatening. PREVENTION  The best way to protect against getting a cold is to practice good hygiene. Avoid oral or hand contact with people with cold symptoms. Wash your hands often if contact occurs. There is no clear evidence that vitamin C, vitamin E, echinacea, or exercise reduces the chance of developing a cold. However, it is always recommended to get plenty of rest and practice good nutrition. TREATMENT  Treatment is directed at relieving symptoms. There is no cure. Antibiotics are not effective, because the infection is caused by a virus, not by bacteria. Treatment may include: 2. Increased fluid intake. Sports drinks offer valuable electrolytes, sugars, and fluids. 3. Breathing heated mist or steam (vaporizer or shower). 4. Eating chicken soup or other clear broths, and maintaining good nutrition. 5. Getting plenty of rest. 6. Using gargles or lozenges for comfort. 7. Controlling fevers with ibuprofen or acetaminophen as directed by your caregiver. 8. Increasing usage of your inhaler if you have asthma. Zinc gel and zinc lozenges, taken in the first 24 hours of the common cold, can shorten the duration and lessen the severity of symptoms. Pain medicines may help with fever, muscle aches, and throat pain. A variety of non-prescription medicines are available to treat congestion and runny nose. Your caregiver can make  recommendations and may suggest nasal or lung inhalers for other symptoms.  HOME CARE INSTRUCTIONS  2. Only take over-the-counter or prescription medicines for pain, discomfort, or fever as directed by your caregiver. 3. Use a warm mist humidifier or inhale steam from a shower to increase air moisture. This may keep secretions moist and make it easier to breathe. 4. Drink enough water and fluids to keep your urine clear or pale yellow. 5. Rest as needed. 6. Return to work when your temperature has returned to normal or as your caregiver advises. You may need to stay home longer to avoid infecting others. You can also use a face mask and careful hand washing to prevent spread of the virus. SEEK MEDICAL CARE IF:  3. After the first few days, you feel you are getting worse rather than better. 4. You need your caregiver's advice about medicines to control symptoms. 5. You develop chills, worsening shortness of breath, or brown or red sputum. These may be signs of pneumonia. 6. You develop yellow or brown nasal discharge or pain in the face, especially when you bend forward. These may be signs of sinusitis. 7. You develop a fever, swollen neck glands, pain with swallowing, or white areas in the back of your throat. These may be signs of strep throat. SEEK IMMEDIATE MEDICAL CARE IF:  3. You have a fever. 4. You develop severe or   persistent headache, ear pain, sinus pain, or chest pain. 5. You develop wheezing, a prolonged cough, cough up blood, or have a change in your usual mucus (if you have chronic lung disease). 6. You develop sore muscles or a stiff neck. Document Released: 08/02/2000 Document Revised: 05/01/2011 Document Reviewed: 05/14/2013 [employer] Patient Information 2015 [employer]. This information is not intended to replace advice given to you by your health care provider. Make sure you discuss any questions you have with your health care provider.    

## 2014-08-03 ENCOUNTER — Encounter: Payer: Self-pay | Admitting: Family

## 2014-08-03 ENCOUNTER — Ambulatory Visit (INDEPENDENT_AMBULATORY_CARE_PROVIDER_SITE_OTHER): Payer: 59 | Admitting: Family

## 2014-08-03 VITALS — BP 120/88 | HR 72 | Temp 98.1°F | Resp 18 | Ht 62.0 in | Wt 122.0 lb

## 2014-08-03 DIAGNOSIS — H66001 Acute suppurative otitis media without spontaneous rupture of ear drum, right ear: Secondary | ICD-10-CM | POA: Diagnosis not present

## 2014-08-03 MED ORDER — AMOXICILLIN-POT CLAVULANATE 875-125 MG PO TABS: 1.0000 | ORAL_TABLET | Freq: Two times a day (BID) | ORAL | Status: DC

## 2014-08-03 NOTE — Assessment & Plan Note (Signed)
Symptoms and exam consistent with otitis media. Start augmentin. Continue over the counter medications as needed for symptom relief and supportive care. Follow up if symptoms worsen or fail to improve.

## 2014-08-03 NOTE — Progress Notes (Signed)
Subjective:    Patient ID: Janet Martinez, female    DOB: Feb 18, 1986, 29 y.o.   MRN: [id number]  Chief Complaint  Patient presents with  . Ear Pain    having pain in both ears, says she has a throbbing feeling, and feels like there is water in her ears    HPI:  Janet Martinez is a 29 y.o. female with a PMH of sinusitis, otitis media, and anxiety, depression and neck muscle spasm who presents today for an acute office visit.   Associated symptoms of pain located in both ears has been going on for about a couple of days. Indicates when she blows her nose there is popping. Last evening there was a lot of throbbing pain in her right ear. Denies fever or chills. Has recently completed azithromycin for an upper respiratory infection.  No Known Allergies  Current Outpatient Prescriptions on File Prior to Visit  Medication Sig Dispense Refill  . ALPRAZolam (XANAX) 0.5 MG tablet Take 1 tablet (0.5 mg total) by mouth 2 (two) times daily as needed for anxiety. 30 tablet 0  . azithromycin (ZITHROMAX) 250 MG tablet Take 2 tablets for 1 day and then 1 tablet daily for 4 days 6 tablet 0  . benzonatate (TESSALON) 100 MG capsule Take 1 capsule (100 mg total) by mouth 2 (two) times daily as needed for cough. 20 capsule 0  . cyclobenzaprine (FLEXERIL) 10 MG tablet Take 1 tablet (10 mg total) by mouth 3 (three) times daily as needed for muscle spasms. 30 tablet 1  . gabapentin (NEURONTIN) 100 MG capsule Take 1 capsule (100 mg total) by mouth at bedtime. 30 capsule 3  . LO LOESTRIN FE 1 MG-10 MCG / 10 MCG tablet   12  . Prenatal Vit-Fe Fumarate-FA (PRENATAL MULTIVITAMIN) TABS Take 1 tablet by mouth at bedtime.      . sertraline (ZOLOFT) 50 MG tablet   12  . temazepam (RESTORIL) 30 MG capsule   0   No current facility-administered medications on file prior to visit.    Review of Systems  Constitutional: Negative for fever and chills.  HENT: Positive for congestion, ear discharge and ear pain. Negative  for sore throat and tinnitus.   Respiratory: Negative for cough, chest tightness and shortness of breath.       Objective:    BP 120/88 mmHg  Pulse 72  Temp(Src) 98.1 F (36.7 C) (Oral)  Resp 18  Ht 5\' 2"  (1.575 m)  Wt 122 lb (55.339 kg)  BMI 22.31 kg/m2  SpO2 99% Nursing note and vital signs reviewed.  Physical Exam  Constitutional: She is oriented to person, place, and time. She appears well-developed and well-nourished. No distress.  HENT:  Right Ear: Hearing and ear canal normal. There is drainage. Tympanic membrane is erythematous.  Left Ear: Hearing, tympanic membrane, external ear and ear canal normal.  Cardiovascular: Normal rate, regular rhythm, normal heart sounds and intact distal pulses.   Pulmonary/Chest: Effort normal and breath sounds normal.  Neurological: She is alert and oriented to person, place, and time.  Skin: Skin is warm and dry.  Psychiatric: She has a normal mood and affect. Her behavior is normal. Judgment and thought content normal.       Assessment & Plan:   Problem List Items Addressed This Visit      Nervous and Auditory   Otitis media - Primary    Symptoms and exam consistent with otitis media. Start augmentin. Continue over the counter medications as  needed for symptom relief and supportive care. Follow up if symptoms worsen or fail to improve.       Relevant Medications   amoxicillin-clavulanate (AUGMENTIN) 875-125 MG per tablet

## 2014-08-03 NOTE — Patient Instructions (Signed)
Thank you for choosing [facility].  Summary/Instructions:  Your prescription(s) have been submitted to your pharmacy or been printed and provided for you. Please take as directed and contact our office if you believe you are having problem(s) with the medication(s) or have any questions.  If your symptoms worsen or fail to improve, please contact our office for further instruction, or in case of emergency go directly to the emergency room at the closest medical facility.    Otitis Media Otitis media is redness, soreness, and inflammation of the middle ear. Otitis media may be caused by allergies or, most commonly, by infection. Often it occurs as a complication of the common cold. SIGNS AND SYMPTOMS Symptoms of otitis media may include:  Earache.  Fever.  Ringing in your ear.  Headache.  Leakage of fluid from the ear. DIAGNOSIS To diagnose otitis media, your health care provider will examine your ear with an otoscope. This is an instrument that allows your health care provider to see into your ear in order to examine your eardrum. Your health care provider also will ask you questions about your symptoms. TREATMENT  Typically, otitis media resolves on its own within 3-5 days. Your health care provider may prescribe medicine to ease your symptoms of pain. If otitis media does not resolve within 5 days or is recurrent, your health care provider may prescribe antibiotic medicines if he or she suspects that a bacterial infection is the cause. HOME CARE INSTRUCTIONS   If you were prescribed an antibiotic medicine, finish it all even if you start to feel better.  Take medicines only as directed by your health care provider.  Keep all follow-up visits as directed by your health care provider. SEEK MEDICAL CARE IF:  You have otitis media only in one ear, or bleeding from your nose, or both.  You notice a lump on your neck.  You are not getting better in 3-5 days.  You feel  worse instead of better. SEEK IMMEDIATE MEDICAL CARE IF:   You have pain that is not controlled with medicine.  You have swelling, redness, or pain around your ear or stiffness in your neck.  You notice that part of your face is paralyzed.  You notice that the bone behind your ear (mastoid) is tender when you touch it. MAKE SURE YOU:   Understand these instructions.  Will watch your condition.  Will get help right away if you are not doing well or get worse. Document Released: 11/12/2003 Document Revised: 06/23/2013 Document Reviewed: 09/03/2012 [facility] Patient Information 2015 [place], [place]. This information is not intended to replace advice given to you by your health care provider. Make sure you discuss any questions you have with your health care provider.

## 2014-08-03 NOTE — Progress Notes (Signed)
Pre visit review using our clinic review tool, if applicable. No additional management support is needed unless otherwise documented below in the visit note. 

## 2014-08-19 ENCOUNTER — Ambulatory Visit: Payer: 59 | Admitting: Family Medicine

## 2014-09-03 ENCOUNTER — Ambulatory Visit (INDEPENDENT_AMBULATORY_CARE_PROVIDER_SITE_OTHER): Payer: 59 | Admitting: Family Medicine

## 2014-09-03 ENCOUNTER — Encounter: Payer: Self-pay | Admitting: Family Medicine

## 2014-09-03 VITALS — BP 100/72 | HR 86 | Ht 62.0 in | Wt 124.0 lb

## 2014-09-03 DIAGNOSIS — M9908 Segmental and somatic dysfunction of rib cage: Secondary | ICD-10-CM

## 2014-09-03 DIAGNOSIS — M9901 Segmental and somatic dysfunction of cervical region: Secondary | ICD-10-CM | POA: Diagnosis not present

## 2014-09-03 DIAGNOSIS — M6248 Contracture of muscle, other site: Secondary | ICD-10-CM

## 2014-09-03 DIAGNOSIS — M62838 Other muscle spasm: Secondary | ICD-10-CM

## 2014-09-03 DIAGNOSIS — M999 Biomechanical lesion, unspecified: Secondary | ICD-10-CM

## 2014-09-03 DIAGNOSIS — M9902 Segmental and somatic dysfunction of thoracic region: Secondary | ICD-10-CM

## 2014-09-03 NOTE — Progress Notes (Signed)
  CC: Neck pain follow up.    HPI: Patient is a very pleasant 29 year old female coming in with neck pain.  Overall patient has been doing relatively well. Patient states still having some mild headaches but this seems to be very intermittent. Patient still has the neck pain. Seems to be worse when she is sitting on her desk for a long amount of time. Denies any new symptoms for some worsening of her previous symptoms. Patient was last seen 6 weeks ago.   Past medical, surgical, family and social history reviewed. Medications reviewed all in the electronic medical record.   Review of Systems: No headache, visual changes, nausea, vomiting, diarrhea, constipation, dizziness, abdominal pain, skin rash, fevers, chills, night sweats, weight loss, swollen lymph nodes, body aches, joint swelling, muscle aches, chest pain, shortness of breath, mood changes.   Objective:    Blood pressure 100/72, pulse 86, height 5\' 2"  (1.575 m), weight 124 lb (56.246 kg), SpO2 93 %.   General: No apparent distress alert and oriented x3 mood and affect normal, dressed appropriately.  HEENT: Pupils equal, extraocular movements intact Respiratory: Patient's speak in full sentences and does not appear short of breath Cardiovascular: No lower extremity edema, non tender, no erythema Skin: Warm dry intact with no signs of infection or rash on extremities or on axial skeleton. Abdomen: Soft nontender not gravid Neuro: Cranial nerves II through XII are intact, neurovascularly intact in all extremities with 2+ DTRs and 2+ pulses. Lymph: No lymphadenopathy of posterior or anterior cervical chain or axillae bilaterally.  Gait normal with good balance and coordination.  MSK: Non tender with full range of motion and good stability and symmetric strength and tone of shoulders, elbows, wrist, hip, knee and ankles bilaterally.  Neck: Inspection unremarkableNo palpable stepoffs. Negative Spurling's maneuver. Full range of  motion Grip strength and sensation normal in bilateral hands Strength good C4 to T1 distribution No sensory change to C4 to T1 Negative Hoffman sign bilaterally Reflexes normal Mild increase in stiffness from previous exam  OMT Physical Exam  Cervical  C2 extended rotated and side bent left C4 flexed rotated and side bent left   Thoracic  T2 extended rotated and side bent right with inhaled second rib T5 extended rotated and side bent left    Impression and Recommendations:     This case required medical decision making of moderate complexity.

## 2014-09-03 NOTE — Progress Notes (Signed)
Pre visit review using our clinic review tool, if applicable. No additional management support is needed unless otherwise documented below in the visit note. 

## 2014-09-03 NOTE — Patient Instructions (Addendum)
Good to see you Ice is your friend Continue to work on posture and the home exercises On wall heels, butt shoulder and head touching for a goal of 5 minutes daily Monitor at eye level Tennis ball between shoulder blades.  See me again 4 weeks.

## 2014-09-03 NOTE — Assessment & Plan Note (Signed)
Is doing relatively well at this time. Encourage patient to do the home exercises in the icing protocol. We discussed continuing to stay active and ergonomic changes at work. We discussed postural changes in patient given some mild home exercises. Patient and will come back and see me again in 4 weeks for further evaluation. Continues to medications.

## 2014-09-03 NOTE — Assessment & Plan Note (Signed)
Decision today to treat with OMT was based on Physical Exam  After verbal consent patient was treated with  ME, FPR, soft tissue techniques in cervical, thoracic, rib,  thoracic areas  Patient tolerated the procedure well with improvement in symptoms  Patient given exercises, stretches and lifestyle modifications  See medications in patient instructions if given  Patient will follow up in 4 weeks

## 2014-09-30 ENCOUNTER — Ambulatory Visit: Payer: 59 | Admitting: Family Medicine

## 2014-10-29 ENCOUNTER — Ambulatory Visit (INDEPENDENT_AMBULATORY_CARE_PROVIDER_SITE_OTHER): Payer: 59 | Admitting: Family Medicine

## 2014-10-29 ENCOUNTER — Encounter: Payer: Self-pay | Admitting: Family Medicine

## 2014-10-29 VITALS — BP 106/76 | HR 74 | Wt 119.0 lb

## 2014-10-29 DIAGNOSIS — M9901 Segmental and somatic dysfunction of cervical region: Secondary | ICD-10-CM

## 2014-10-29 DIAGNOSIS — M999 Biomechanical lesion, unspecified: Secondary | ICD-10-CM

## 2014-10-29 DIAGNOSIS — M9902 Segmental and somatic dysfunction of thoracic region: Secondary | ICD-10-CM | POA: Diagnosis not present

## 2014-10-29 DIAGNOSIS — M6248 Contracture of muscle, other site: Secondary | ICD-10-CM

## 2014-10-29 DIAGNOSIS — M9903 Segmental and somatic dysfunction of lumbar region: Secondary | ICD-10-CM | POA: Diagnosis not present

## 2014-10-29 DIAGNOSIS — M62838 Other muscle spasm: Secondary | ICD-10-CM

## 2014-10-29 MED ORDER — ACYCLOVIR 400 MG PO TABS: 400.0000 mg | ORAL_TABLET | Freq: Three times a day (TID) | ORAL | Status: DC

## 2014-10-29 MED ORDER — MELOXICAM 15 MG PO TABS: 15.0000 mg | ORAL_TABLET | Freq: Every day | ORAL | Status: DC

## 2014-10-29 MED ORDER — CYCLOBENZAPRINE HCL 10 MG PO TABS: 10.0000 mg | ORAL_TABLET | Freq: Three times a day (TID) | ORAL | Status: DC | PRN

## 2014-10-29 NOTE — Assessment & Plan Note (Signed)
Decision today to treat with OMT was based on Physical Exam  After verbal consent patient was treated with  ME, FPR, soft tissue techniques in cervical, thoracic, rib,  thoracic areas  Patient tolerated the procedure well with improvement in symptoms  Patient given exercises, stretches and lifestyle modifications  See medications in patient instructions if given  Patient will follow up in 1-2 weeks

## 2014-10-29 NOTE — Progress Notes (Signed)
  CC: Neck pain follow up.    HPI: Patient is a very pleasant 29 year old female coming in with neck pain. Had been doing very well and is having an exacerbation of her neck pain again. Mild radicular symptoms down the arm but very minimal and intermittent. Denies any weakness. States that it's very difficult to become comfortable. Patient states that even sleeping was a difficult last night. Does not remember any true injury. No association that she can find that contributes to this pain. Patient has not been doing the exercises regularly. Not taking the vitamins regularly.   Past medical, surgical, family and social history reviewed. Medications reviewed all in the electronic medical record.   Review of Systems: No headache, visual changes, nausea, vomiting, diarrhea, constipation, dizziness, abdominal pain, skin rash, fevers, chills, night sweats, weight loss, swollen lymph nodes, body aches, joint swelling, muscle aches, chest pain, shortness of breath, mood changes.   Objective:    Blood pressure 106/76, pulse 74, weight 119 lb (53.978 kg).   General: No apparent distress alert and oriented x3 mood and affect normal, dressed appropriately.  HEENT: Pupils equal, extraocular movements intact Respiratory: Patient's speak in full sentences and does not appear short of breath Cardiovascular: No lower extremity edema, non tender, no erythema Skin: Warm dry intact with no signs of infection or rash on extremities or on axial skeleton. Abdomen: Soft nontender not gravid Neuro: Cranial nerves II through XII are intact, neurovascularly intact in all extremities with 2+ DTRs and 2+ pulses. Lymph: No lymphadenopathy of posterior or anterior cervical chain or axillae bilaterally.  Gait normal with good balance and coordination.  MSK: Non tender with full range of motion and good stability and symmetric strength and tone of shoulders, elbows, wrist, hip, knee and ankles bilaterally.  Neck: Inspection  unremarkableNo palpable stepoffs. Negative Spurling's maneuver. Limited range of motion lacking the last 5 of rotation and side bending bilaterally Grip strength and sensation normal in bilateral hands Strength good C4 to T1 distribution No sensory change to C4 to T1 Negative Hoffman sign bilaterally Reflexes normal Mild increase in stiffness from previous exam  OMT Physical Exam  Cervical  C2 extended rotated and side bent left C4 flexed rotated and side bent left C6 flexed rotated and side bent right  Thoracic  T2 extended rotated and side bent right with inhaled second rib T5 extended rotated and side bent left     Impression and Recommendations:     This case required medical decision making of moderate complexity.

## 2014-10-29 NOTE — Assessment & Plan Note (Signed)
Patient having another muscle spasm. Patient given an anti-inflammatory to take daily for 10 days as well as a muscle relaxer to take at night. Continues to have difficulty will make consider further imaging. Patient will start home exercises again on a regular basis. We discussed icing regimen. She will come back again in 7-10 days for further evaluation and treatment.

## 2014-10-29 NOTE — Patient Instructions (Addendum)
Good to see you Injected you today Ice is your friend Meloxicam daily for 10 days then as needed Flexeril at night See me or call me next week and we will discuss.  Acyclovir 3 times daily for 5 days for flares of the mouth and if too frquent we will do another dose daily to prevent.

## 2014-11-03 ENCOUNTER — Telehealth: Payer: Self-pay | Admitting: *Deleted

## 2014-11-03 ENCOUNTER — Other Ambulatory Visit: Payer: Self-pay | Admitting: *Deleted

## 2014-11-03 MED ORDER — PREDNISONE 50 MG PO TABS: ORAL_TABLET | ORAL | Status: DC

## 2014-11-03 NOTE — Telephone Encounter (Signed)
Receive call from pt mom (Mrs. Shirlean Mylar) want to inform Ria Comment to send medication to [facility] @ [place] # [phone]...Johny Chess

## 2014-11-03 NOTE — Telephone Encounter (Signed)
Pt called complaining of more neck pain & inflammation. Pt requested Prednisone be sent into pharmacy while they are away on vacation.  Per dr Tamala Julian, okay to send in prednison 50mg  #5.

## 2014-11-03 NOTE — Telephone Encounter (Signed)
rx has already been sent to pharmacy.   

## 2014-11-26 ENCOUNTER — Other Ambulatory Visit: Payer: Self-pay | Admitting: Family Medicine

## 2014-11-27 NOTE — Telephone Encounter (Signed)
Refill done.  

## 2015-03-01 ENCOUNTER — Encounter: Payer: Self-pay | Admitting: Family Medicine

## 2015-03-01 ENCOUNTER — Ambulatory Visit (INDEPENDENT_AMBULATORY_CARE_PROVIDER_SITE_OTHER): Payer: 59 | Admitting: Family Medicine

## 2015-03-01 VITALS — BP 100/80 | HR 105 | Temp 98.1°F | Resp 16 | Ht 62.0 in | Wt 133.3 lb

## 2015-03-01 DIAGNOSIS — M791 Myalgia: Secondary | ICD-10-CM

## 2015-03-01 DIAGNOSIS — IMO0001 Reserved for inherently not codable concepts without codable children: Secondary | ICD-10-CM

## 2015-03-01 DIAGNOSIS — B349 Viral infection, unspecified: Secondary | ICD-10-CM | POA: Diagnosis not present

## 2015-03-01 DIAGNOSIS — M609 Myositis, unspecified: Secondary | ICD-10-CM

## 2015-03-01 LAB — POCT INFLUENZA A/B
Influenza A, POC: NEGATIVE
Influenza B, POC: NEGATIVE

## 2015-03-01 NOTE — Patient Instructions (Signed)
Viral Infections °A viral infection can be caused by different types of viruses. Most viral infections are not serious and resolve on their own. However, some infections may cause severe symptoms and may lead to further complications. °SYMPTOMS °Viruses can frequently cause: °· Minor sore throat. °· Aches and pains. °· Headaches. °· Runny nose. °· Different types of rashes. °· Watery eyes. °· Tiredness. °· Cough. °· Loss of appetite. °· Gastrointestinal infections, resulting in nausea, vomiting, and diarrhea. °These symptoms do not respond to antibiotics because the infection is not caused by bacteria. However, you might catch a bacterial infection following the viral infection. This is sometimes called a "superinfection." Symptoms of such a bacterial infection may include: °· Worsening sore throat with pus and difficulty swallowing. °· Swollen neck glands. °· Chills and a high or persistent fever. °· Severe headache. °· Tenderness over the sinuses. °· Persistent overall ill feeling (malaise), muscle aches, and tiredness (fatigue). °· Persistent cough. °· Yellow, green, or brown mucus production with coughing. °HOME CARE INSTRUCTIONS  °· Only take over-the-counter or prescription medicines for pain, discomfort, diarrhea, or fever as directed by your caregiver. °· Drink enough water and fluids to keep your urine clear or pale yellow. Sports drinks can provide valuable electrolytes, sugars, and hydration. °· Get plenty of rest and maintain proper nutrition. Soups and broths with crackers or rice are fine. °SEEK IMMEDIATE MEDICAL CARE IF:  °· You have severe headaches, shortness of breath, chest pain, neck pain, or an unusual rash. °· You have uncontrolled vomiting, diarrhea, or you are unable to keep down fluids. °· You or your child has an oral temperature above 102° F (38.9° C), not controlled by medicine. °· Your baby is older than 3 months with a rectal temperature of 102° F (38.9° C) or higher. °· Your baby is 3  months old or younger with a rectal temperature of 100.4° F (38° C) or higher. °MAKE SURE YOU:  °· Understand these instructions. °· Will watch your condition. °· Will get help right away if you are not doing well or get worse. °  °This information is not intended to replace advice given to you by your health care provider. Make sure you discuss any questions you have with your health care provider. °  °Document Released: 11/16/2004 Document Revised: 05/01/2011 Document Reviewed: 07/15/2014 °Elsevier Interactive Patient Education ©2016 [employer] Inc[employer] ° °

## 2015-03-01 NOTE — Progress Notes (Signed)
   Subjective:    Patient ID: Janet Martinez, female    DOB: 08/08/1985, 29 y.o.   MRN: [id number]  HPI Acute visit Patient seen with onset yesterday of body aches, myalgias, nasal congestion, subjective low-grade fever, sore throat, mild cough. Denies any nausea or vomiting. No sick contacts. Did not receive flu vaccine this year. No skin rashes. Mild headaches  Past Medical History  Diagnosis Date  . History of chicken pox   . Medical history non-contributory    Past Surgical History  Procedure Laterality Date  . Wisdom tooth extraction    . No past surgeries      reports that she has never smoked. She does not have any smokeless tobacco history on file. She reports that she does not drink alcohol or use illicit drugs. family history includes Breast cancer in her other; Cancer in her paternal aunt and paternal grandmother; Mental illness in her sister; Miscarriages / Stillbirths in her mother; Seizures in her brother. No Known Allergies    Review of Systems  Constitutional: Positive for fever, chills and fatigue.  HENT: Positive for congestion and sore throat.   Respiratory: Positive for cough.   Gastrointestinal: Negative for nausea and vomiting.  Skin: Negative for rash.  Neurological: Positive for headaches.       Objective:   Physical Exam  Constitutional: She appears well-developed and well-nourished.  HENT:  Right Ear: External ear normal.  Left Ear: External ear normal.  Mouth/Throat: Oropharynx is clear and moist.  Neck: Neck supple.  Cardiovascular: Normal rate and regular rhythm.   Pulmonary/Chest: Effort normal and breath sounds normal. No respiratory distress. She has no wheezes. She has no rales.  Lymphadenopathy:    She has no cervical adenopathy.  Skin: No rash noted.          Assessment & Plan:  Febrile illness. Suspect viral. Check rapid influenza screen. If negative straight symptomatically  Influenza screen negative. Treat symptomatically.  Follow-up as needed

## 2015-03-01 NOTE — Progress Notes (Signed)
Pre visit review using our clinic review tool, if applicable. No additional management support is needed unless otherwise documented below in the visit note. 

## 2015-04-17 ENCOUNTER — Encounter: Payer: Self-pay | Admitting: Internal Medicine

## 2015-04-17 ENCOUNTER — Ambulatory Visit (INDEPENDENT_AMBULATORY_CARE_PROVIDER_SITE_OTHER): Payer: 59 | Admitting: Internal Medicine

## 2015-04-17 VITALS — BP 110/70 | HR 67 | Temp 99.0°F | Resp 18 | Ht 62.0 in | Wt 127.8 lb

## 2015-04-17 DIAGNOSIS — J069 Acute upper respiratory infection, unspecified: Secondary | ICD-10-CM | POA: Diagnosis not present

## 2015-04-17 MED ORDER — HYDROCODONE-HOMATROPINE 5-1.5 MG/5ML PO SYRP: 5.0000 mL | ORAL_SOLUTION | Freq: Four times a day (QID) | ORAL | Status: DC | PRN

## 2015-04-17 MED ORDER — CIPROFLOXACIN HCL 500 MG PO TABS: 500.0000 mg | ORAL_TABLET | Freq: Two times a day (BID) | ORAL | Status: DC

## 2015-04-17 NOTE — Progress Notes (Signed)
Subjective:    Patient ID: Janet Martinez, female    DOB: 04-11-1985, 30 y.o.   MRN: [id number]  HPI  30 year old patient who presents with a 2 to three-day history of cough, myalgias, hoarseness.  She has been exposed to a number of coworkers who have been ill.  No flu vaccine this year.  Denies any high fever, chills, shortness of breath or wheezing.  Past Medical History  Diagnosis Date  . History of chicken pox   . Medical history non-contributory     Social History   Social History  . Marital Status: Married    Spouse Name: N/A  . Number of Children: N/A  . Years of Education: N/A   Occupational History  . Not on file.   Social History Main Topics  . Smoking status: Never Smoker   . Smokeless tobacco: Not on file  . Alcohol Use: No  . Drug Use: No  . Sexual Activity: Yes   Other Topics Concern  . Not on file   Social History Narrative   ** Merged History Encounter **       ** Data from: 02/08/11 Enc Dept: [facility]       ** Data from: 09/28/11 Enc Dept: [facility]   Married, lives with spouse and 1 dtr, working at [employer]    Past Surgical History  Procedure Laterality Date  . Wisdom tooth extraction    . No past surgeries      Family History  Problem Relation Age of Onset  . Mental illness Sister     anxiety  . Seizures Brother   . Cancer Paternal Grandmother     ovarian  . Miscarriages / [place] Mother   . Cancer Paternal Aunt     multiple types  . Breast cancer Other     No Known Allergies  No current outpatient prescriptions on file prior to visit.   No current facility-administered medications on file prior to visit.    BP 110/70 mmHg  Pulse 67  Temp(Src) 99 F (37.2 C) (Oral)  Resp 18  Ht 5\' 2"  (1.575 m)  Wt 127 lb 12 oz (57.947 kg)  BMI 23.36 kg/m2  SpO2 98%     Review of Systems  Constitutional: Positive for fatigue.  HENT: Positive for congestion, ear pain, postnasal drip, sinus pressure and  voice change. Negative for dental problem, hearing loss, rhinorrhea, sore throat and tinnitus.   Eyes: Negative for pain, discharge and visual disturbance.  Respiratory: Negative for cough, shortness of breath and wheezing.   Cardiovascular: Negative for chest pain, palpitations and leg swelling.  Gastrointestinal: Negative for nausea, vomiting, abdominal pain, diarrhea, constipation, blood in stool and abdominal distention.  Genitourinary: Negative for dysuria, urgency, frequency, hematuria, flank pain, vaginal bleeding, vaginal discharge, difficulty urinating, vaginal pain and pelvic pain.  Musculoskeletal: Positive for myalgias. Negative for joint swelling, arthralgias and gait problem.  Skin: Negative for rash.  Neurological: Positive for headaches. Negative for dizziness, syncope, speech difficulty, weakness and numbness.  Hematological: Negative for adenopathy.  Psychiatric/Behavioral: Negative for behavioral problems, dysphoric mood and agitation. The patient is not nervous/anxious.        Objective:   Physical Exam  Constitutional: She is oriented to person, place, and time. She appears well-developed and well-nourished. No distress.  Clinically, looks well.  No distress Afebrile O2 saturation 98%   HENT:  Head: Normocephalic.  Right Ear: External ear normal.  Left Ear: External ear normal.  Mouth/Throat: Oropharynx is clear  and moist.  No sinus tenderness  Eyes: Conjunctivae and EOM are normal. Pupils are equal, round, and reactive to light.  Neck: Normal range of motion. Neck supple. No thyromegaly present.  Cardiovascular: Normal rate, regular rhythm, normal heart sounds and intact distal pulses.   Pulmonary/Chest: Effort normal and breath sounds normal. No respiratory distress. She has no wheezes. She has no rales.  Abdominal: Soft. Bowel sounds are normal. She exhibits no mass. There is no tenderness.  Musculoskeletal: Normal range of motion.  Lymphadenopathy:    She  has no cervical adenopathy.  Neurological: She is alert and oriented to person, place, and time.  Skin: Skin is warm and dry. No rash noted.  Psychiatric: She has a normal mood and affect. Her behavior is normal.          Assessment & Plan:   Viral URI   We'll treat symptomatically with decongestants, mucolytic's and hydration

## 2015-04-17 NOTE — Progress Notes (Signed)
Pre-visit discussion using our clinic review tool. No additional management support is needed unless otherwise documented below in the visit note.  

## 2015-04-17 NOTE — Patient Instructions (Signed)
Acute bronchitis symptoms for less than 10 days are generally not helped by antibiotics.  Take over-the-counter expectorants and cough medications such as  Mucinex DM.  Call if there is no improvement in 5 to 7 days or if  you develop worsening cough, fever, or new symptoms, such as shortness of breath or chest pain.  Take 650-1000 mg of Tylenol every 6 hours as needed for pain relief or fever.  Avoid taking more than 3000 mg in a 24-hour period (  This may cause liver damage). 

## 2015-11-10 ENCOUNTER — Ambulatory Visit (INDEPENDENT_AMBULATORY_CARE_PROVIDER_SITE_OTHER): Payer: 59 | Admitting: Family Medicine

## 2015-11-10 ENCOUNTER — Encounter: Payer: Self-pay | Admitting: Family Medicine

## 2015-11-10 DIAGNOSIS — M9908 Segmental and somatic dysfunction of rib cage: Secondary | ICD-10-CM

## 2015-11-10 DIAGNOSIS — M62838 Other muscle spasm: Secondary | ICD-10-CM

## 2015-11-10 DIAGNOSIS — M9901 Segmental and somatic dysfunction of cervical region: Secondary | ICD-10-CM

## 2015-11-10 DIAGNOSIS — M6248 Contracture of muscle, other site: Secondary | ICD-10-CM

## 2015-11-10 DIAGNOSIS — M9902 Segmental and somatic dysfunction of thoracic region: Secondary | ICD-10-CM | POA: Diagnosis not present

## 2015-11-10 DIAGNOSIS — M9903 Segmental and somatic dysfunction of lumbar region: Secondary | ICD-10-CM

## 2015-11-10 DIAGNOSIS — M999 Biomechanical lesion, unspecified: Secondary | ICD-10-CM

## 2015-11-10 MED ORDER — CYCLOBENZAPRINE HCL 10 MG PO TABS: 10.0000 mg | ORAL_TABLET | Freq: Three times a day (TID) | ORAL | 0 refills | Status: DC | PRN

## 2015-11-10 NOTE — Progress Notes (Signed)
  CC: Neck pain follow up.    HPI: Patient is a very pleasant 30 year old female coming in with neck pain. Has been quite some time since we seen patient. Not having any radicular symptoms. States that she has had 2 exacerbations with the neck pain seems to be getting tighter again. Describes pain as a tightness more on the right than the left side. Denies any numbness or tingling. Patient states though that it is eating given her some difficult. Night. Has changed jobs where she is not doing more seated work.  Past Medical History:  Diagnosis Date  . History of chicken pox   . Medical history non-contributory    Past Surgical History:  Procedure Laterality Date  . NO PAST SURGERIES    . WISDOM TOOTH EXTRACTION     Social History  Substance Use Topics  . Smoking status: Never Smoker  . Smokeless tobacco: Not on file  . Alcohol use No   No Known Allergies Family History  Problem Relation Age of Onset  . Mental illness Sister     anxiety  . Seizures Brother   . Cancer Paternal Grandmother     ovarian  . Miscarriages / [place] Mother   . Cancer Paternal Aunt     multiple types  . Breast cancer Other       Past medical, surgical, family and social history reviewed. Medications reviewed all in the electronic medical record.   Review of Systems: No headache, visual changes, nausea, vomiting, diarrhea, constipation, dizziness, abdominal pain, skin rash, fevers, chills, night sweats, weight loss, swollen lymph nodes, body aches, joint swelling, muscle aches, chest pain, shortness of breath, mood changes.   Objective:    Blood pressure 110/72, pulse 63, weight 126 lb (57.2 kg).   General: No apparent distress alert and oriented x3 mood and affect normal, dressed appropriately.  HEENT: Pupils equal, extraocular movements intact Respiratory: Patient's speak in full sentences and does not appear short of breath Cardiovascular: No lower extremity edema, non tender, no  erythema Skin: Warm dry intact with no signs of infection or rash on extremities or on axial skeleton. Abdomen: Soft nontender not gravid Neuro: Cranial nerves II through XII are intact, neurovascularly intact in all extremities with 2+ DTRs and 2+ pulses. Lymph: No lymphadenopathy of posterior or anterior cervical chain or axillae bilaterally.  Gait normal with good balance and coordination.  MSK: Non tender with full range of motion and good stability and symmetric strength and tone of shoulders, elbows, wrist, hip, knee and ankles bilaterally.  Neck: Inspection unremarkableNo palpable stepoffs. Negative Spurling's maneuver. Patient lacking the last 10 of extension as well as the last 5 of side bending and rotation bilaterally Grip strength and sensation normal in bilateral hands Strength good C4 to T1 distribution No sensory change to C4 to T1 Negative Hoffman sign bilaterally Reflexes normal Mild increase in stiffness from previous exam  OMT Physical Exam  Cervical  C2 extended rotated and side bent left C4 flexed rotated and side bent left C7 flexed rotated and side bent right  Thoracic  T2 extended rotated and side bent right with inhaled second rib T7 extended rotated and side bent left     Impression and Recommendations:     This case required medical decision making of moderate complexity.

## 2015-11-10 NOTE — Assessment & Plan Note (Signed)
Decision today to treat with OMT was based on Physical Exam  After verbal consent patient was treated with  ME, FPR, soft tissue techniques in cervical, thoracic, rib,  thoracic areas  Patient tolerated the procedure well with improvement in symptoms  Patient given exercises, stretches and lifestyle modifications  See medications in patient instructions if given  Patient will follow up in 6-8 weeks

## 2015-11-10 NOTE — Assessment & Plan Note (Signed)
Patient has responded to manipulation in the past. Given prescription for muscle relaxer to take at night. We discussed icing regimen as well. We discussed avoiding certain activities. In short course of anti-inflammatories given. Patient will come back and see me again in 6 weeks for further evaluation and treatment.

## 2015-11-10 NOTE — Patient Instructions (Signed)
gooto see you  I am so happy for you  Ice 20 minutes 2 times daily. Usually after activity and before bed. Flexreil at night for 3 nights then as needed Duexis 3 times a day for 3 days See me agai nin 6-8 weeks

## 2016-02-19 IMAGING — US US OB LIMITED
1 series · 13 of 21 positions shown · non-contrast
Comparison: none

[Series 1: us ob limited · 0.23mm/px · 13 of 21 slices shown]
[im 1/21]
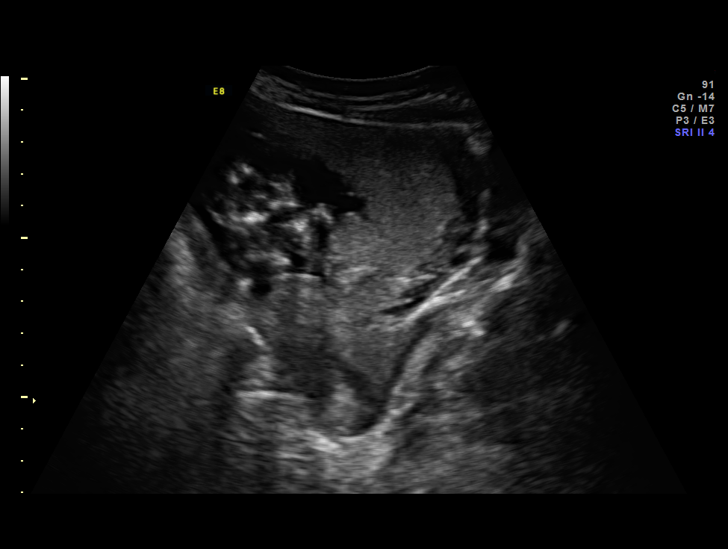
[im 3/21]
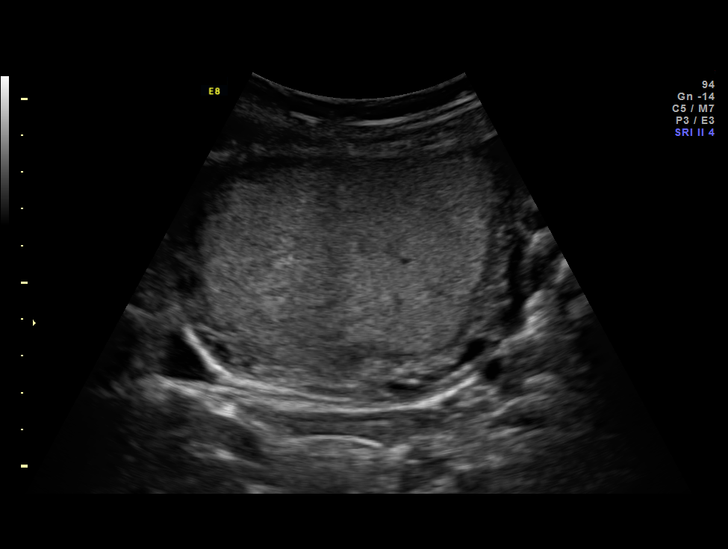
[im 5/21]
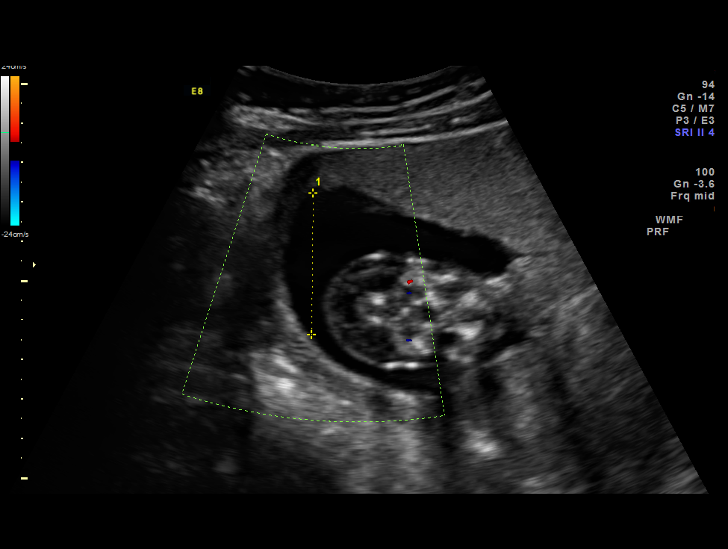
[im 6/21]
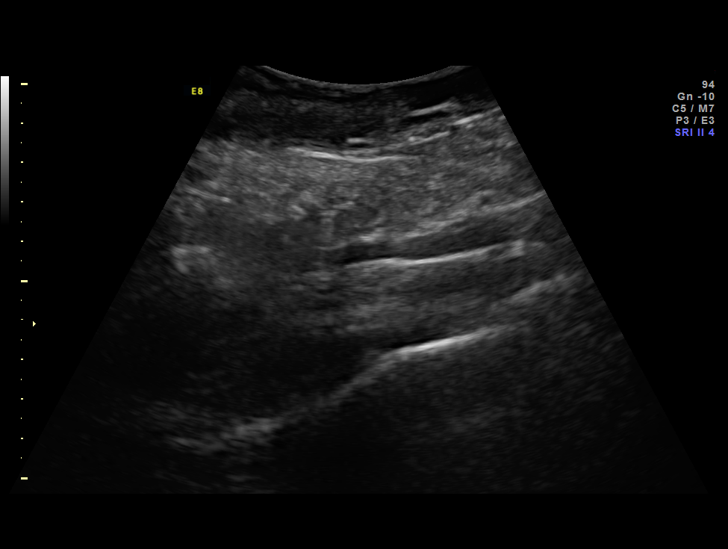
[im 8/21]
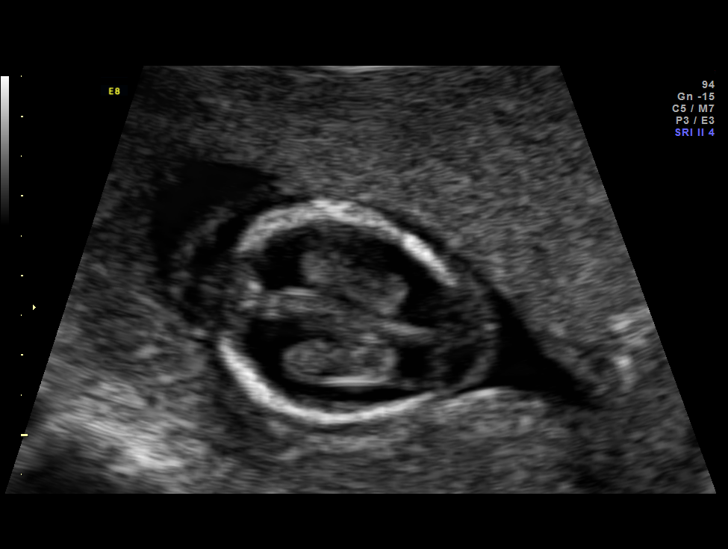
[im 9/21]
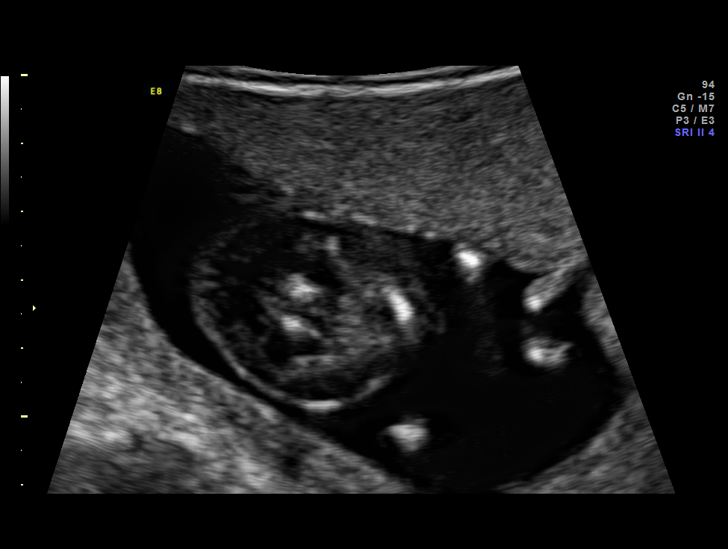
[im 11/21]
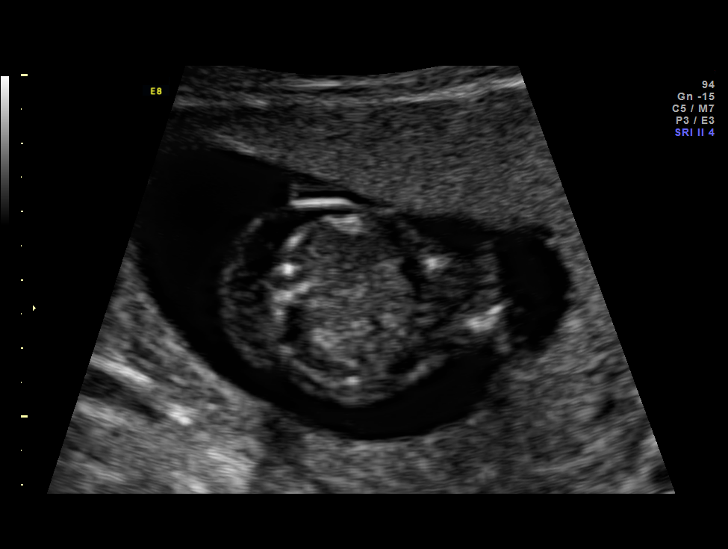
[im 13/21]
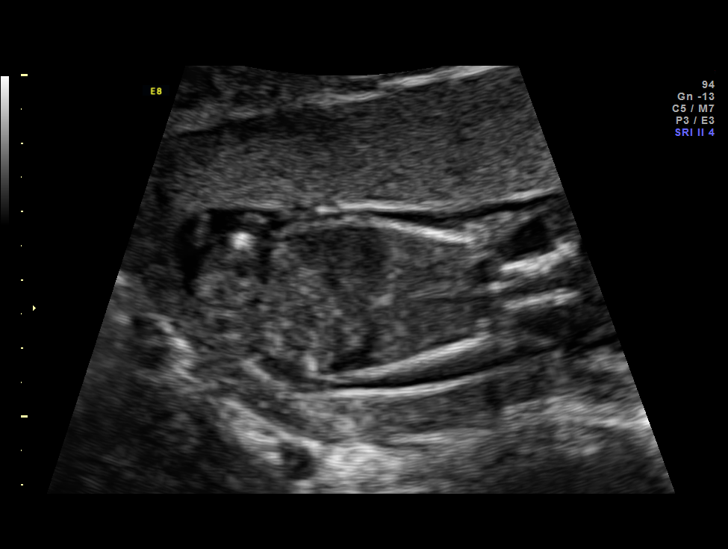
[im 14/21]
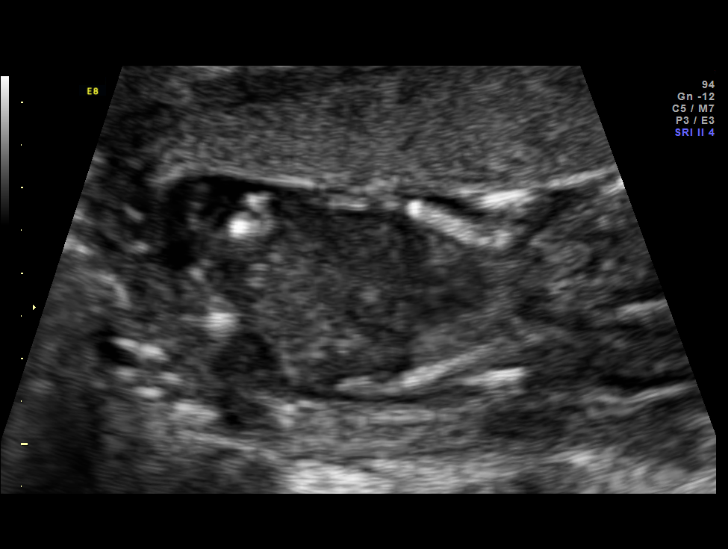
[im 16/21]
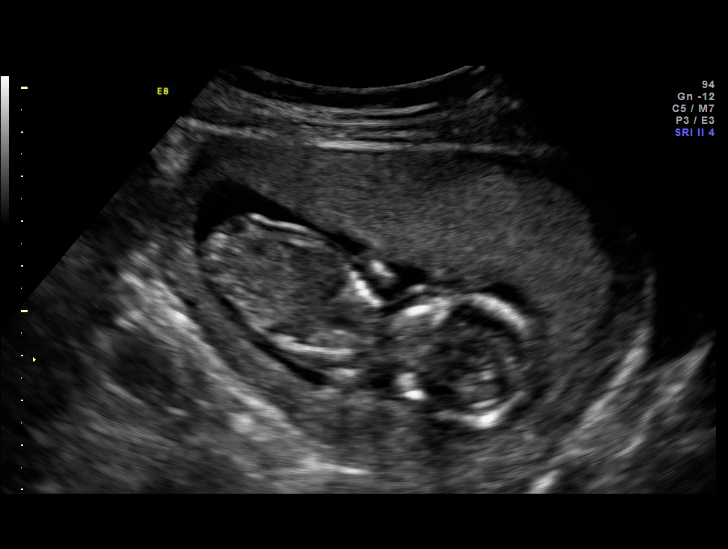
[im 17/21]
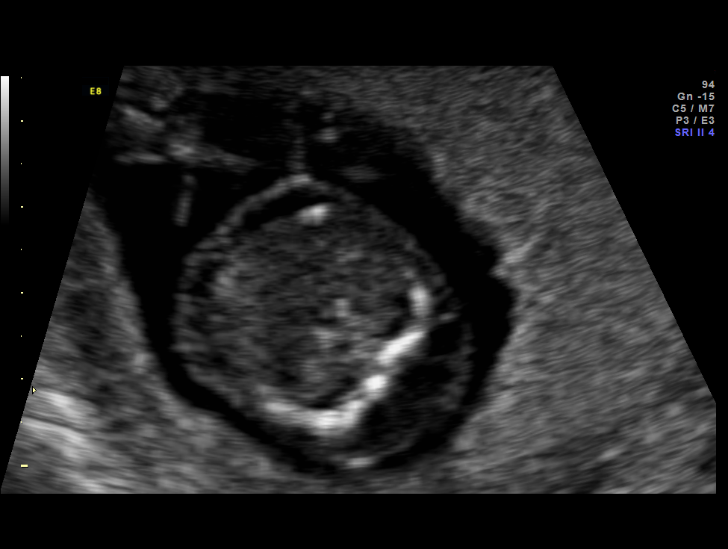
[im 19/21]
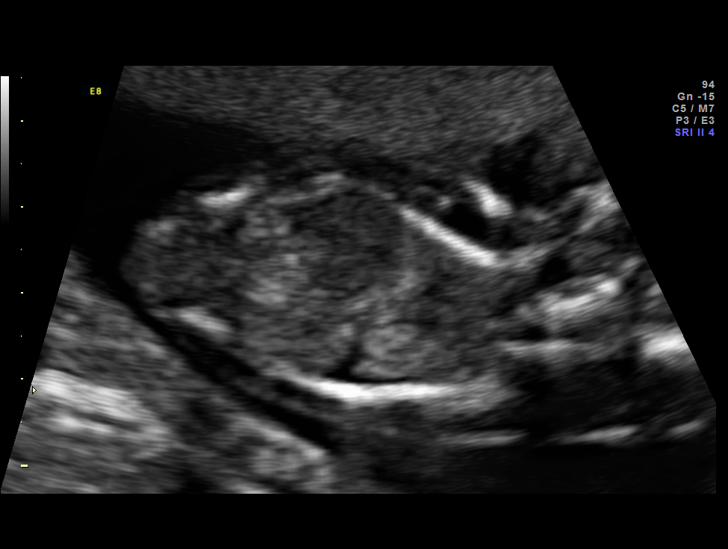
[im 21/21]
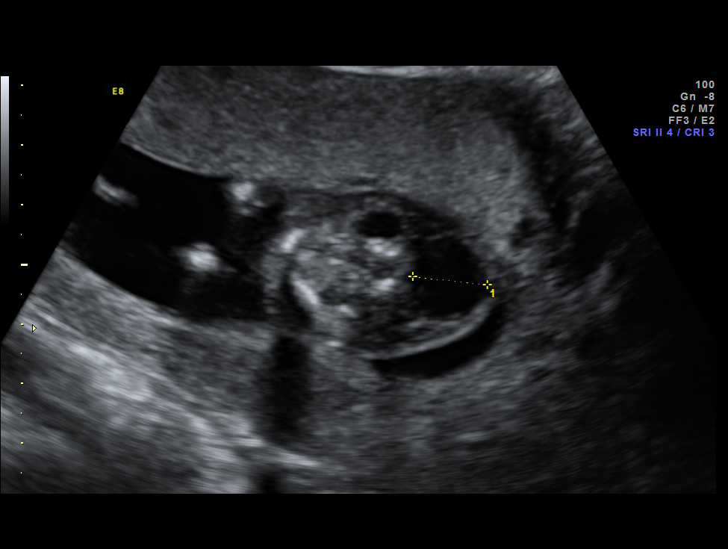

[13 of 21 positions shown; findings below may reference images not displayed]

OBSTETRICS REPORT
                      (Signed Final 01/14/2014 [DATE])

Service(s) Provided

 [HOSPITAL]                                         76815.0
Indications

 Cystic hygroma
 Low risk NIPS (female)
 Poor obstetric history: Previous midtrimester loss
 (18 weeks, hygroma, 46 XY)
 15 weeks gestation of pregnancy
Fetal Evaluation

 Num Of Fetuses:    1
 Fetal Heart Rate:  157                          bpm
 Cardiac Activity:  Observed
 Presentation:      Variable
 Placenta:          Anterior

 Amniotic Fluid
 AFI FV:      Subjectively within normal limits
Gestational Age

 LMP:           15w 1d        Date:  09/30/13                 EDD:   07/07/14
 Best:          15w 1d     Det. By:  LMP  (09/30/13)          EDD:   07/07/14
Anatomy

 Nuchal Fold:      Cystic hygroma
Cervix Uterus Adnexa

 Cervix:       Normal appearance by transabdominal scan.

 Adnexa:     No abnormality visualized.
Impression

 SIUP at 47w4d
 Fetus in "pike position" with no movement/change in position
 of lower extremities; upper extremities tend to stay in front of
 face with little movement, lending to constellation of
 arthrogryposis
 Pleural effusion
 Ascites
 Large cystic hygroma
 Female genitalia apparent
 NIPS (low risk, "46 xx")
 Suspicion for neuromuscular disorder
 (amyotonia/arthrogryposis spectrum)
Recommendations

 1. see genetic counseling
 2. viability scans q 1 week (with attempt for formal
 anatomy/biometry next week)
 3. declined amniocentesis/termination
 4. postnatal medical genetics evaluation

 questions or concerns.

## 2016-03-23 IMAGING — US US OB LIMITED
1 series · 13 of 26 positions shown · non-contrast
Comparison: none

[Series 1: us ob limited · 0.17mm/px · 13 of 26 slices shown]
[im 2/26]
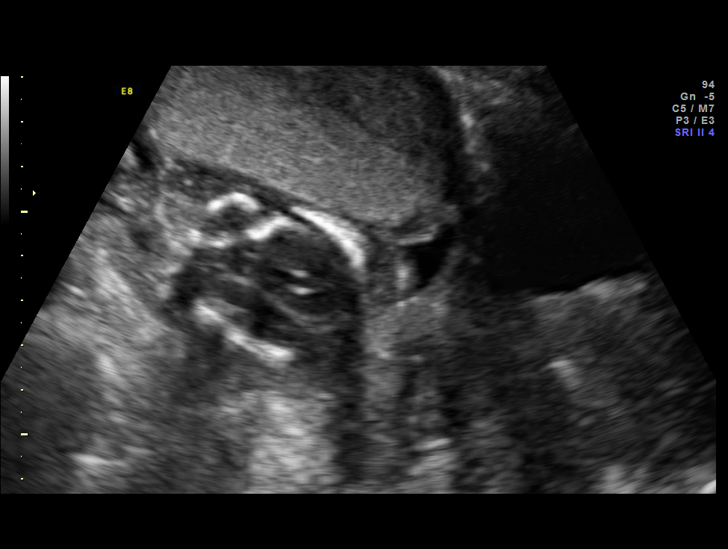
[im 4/26]
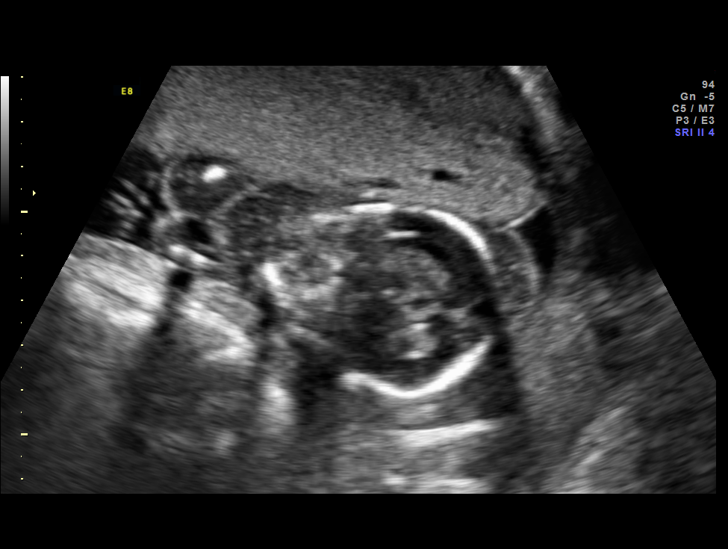
[im 6/26]
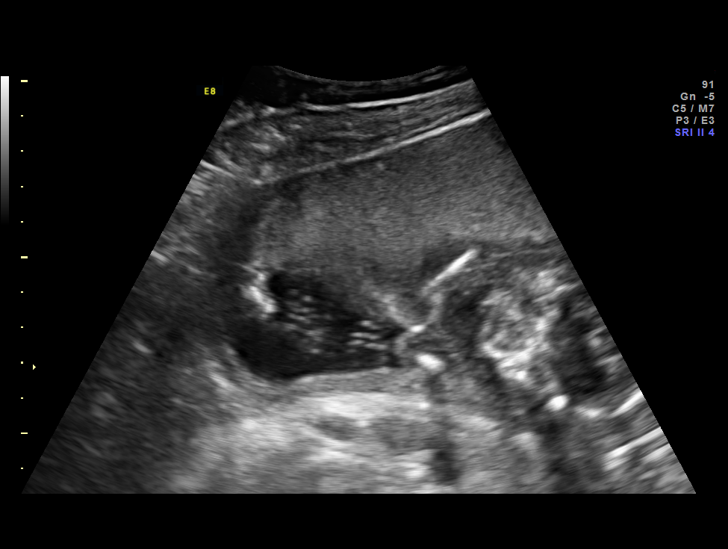
[im 8/26]
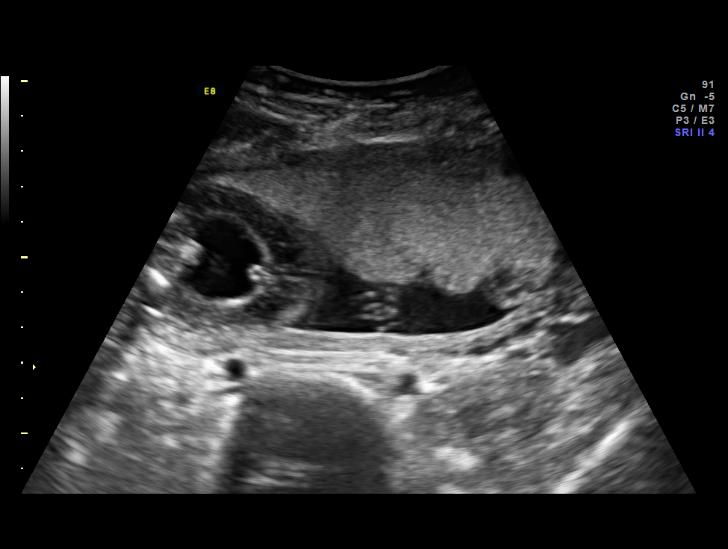
[im 10/26]
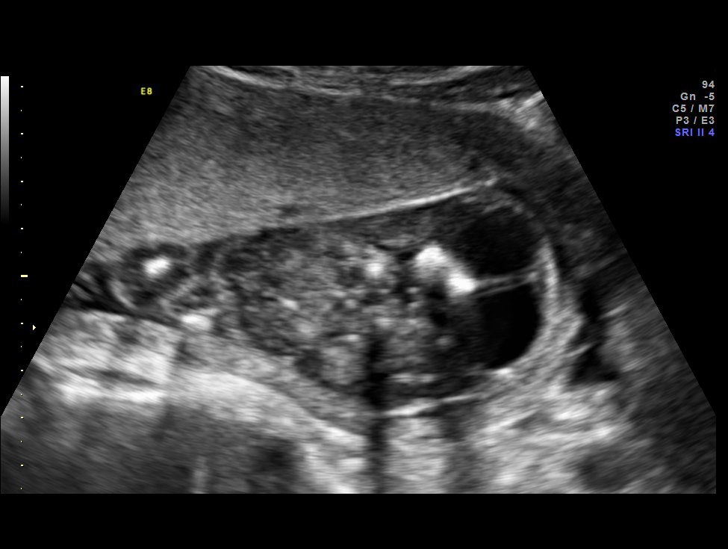
[im 12/26]
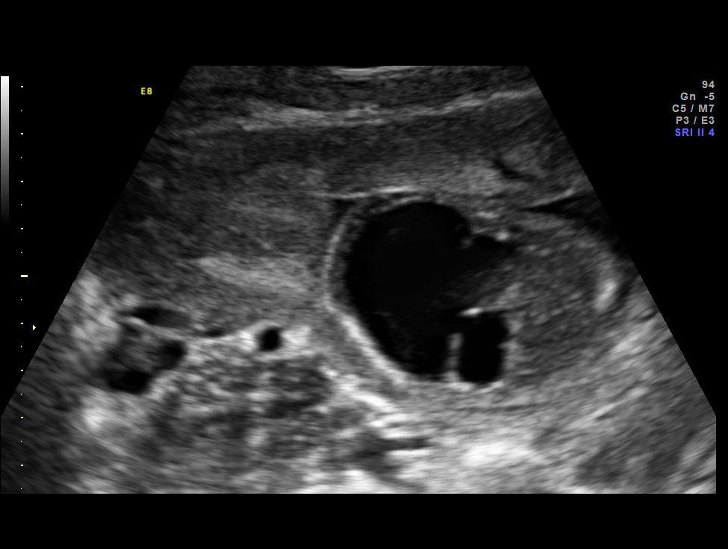
[im 14/26]
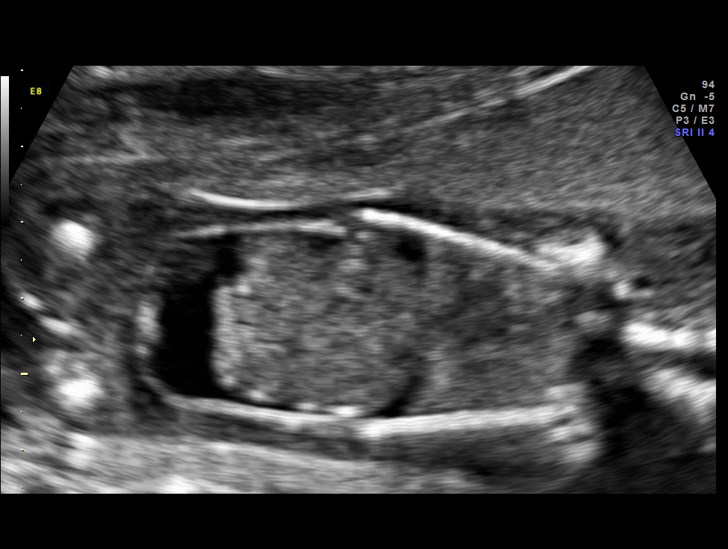
[im 16/26]
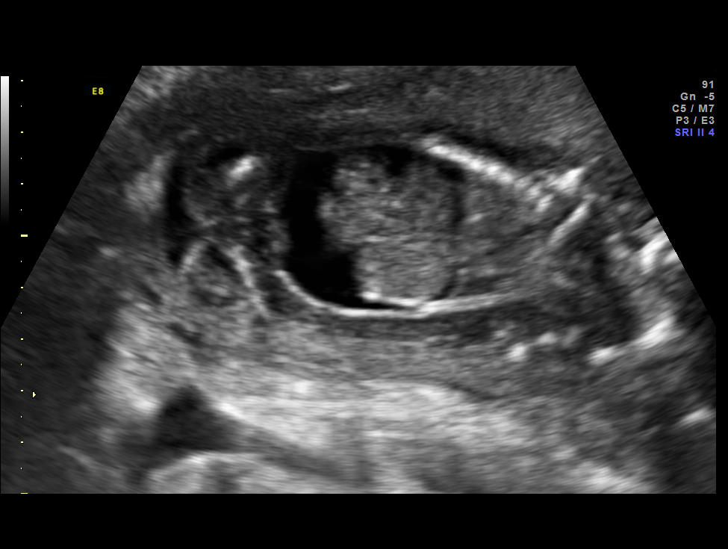
[im 18/26]
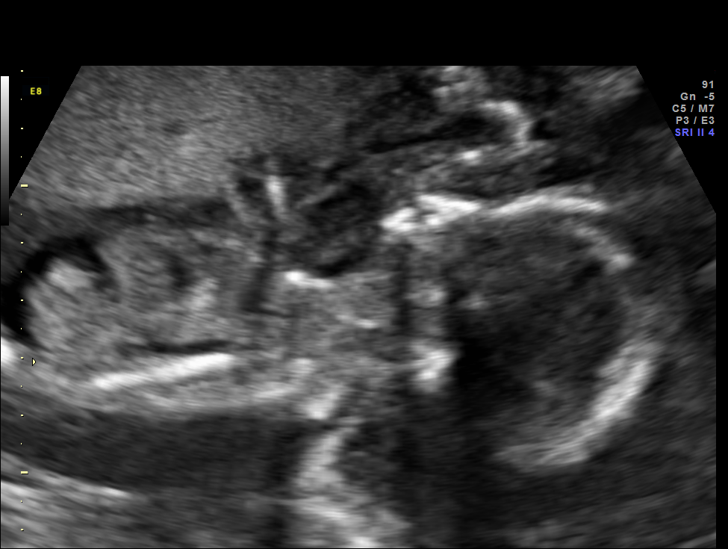
[im 20/26]
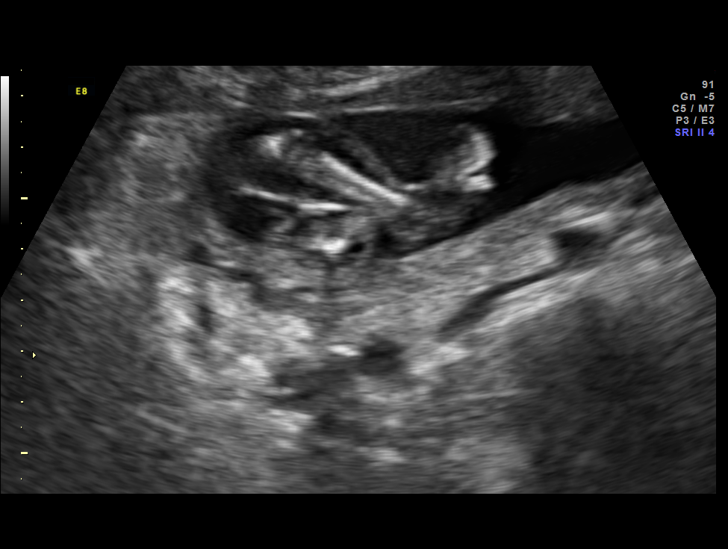
[im 22/26]
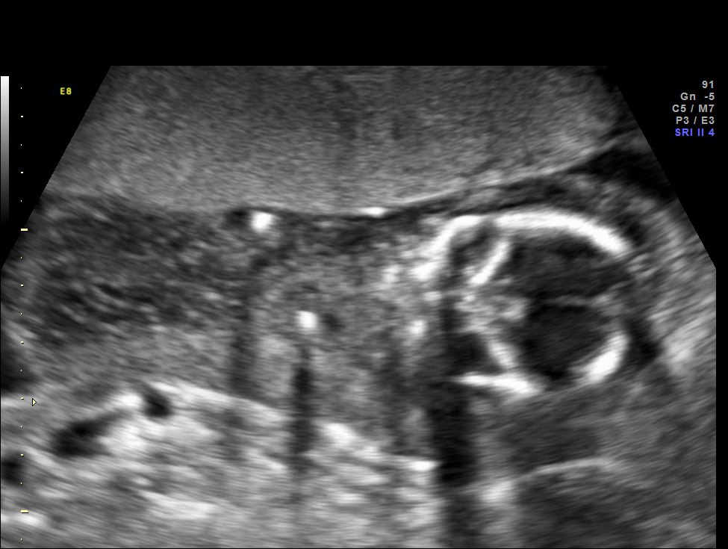
[im 24/26]
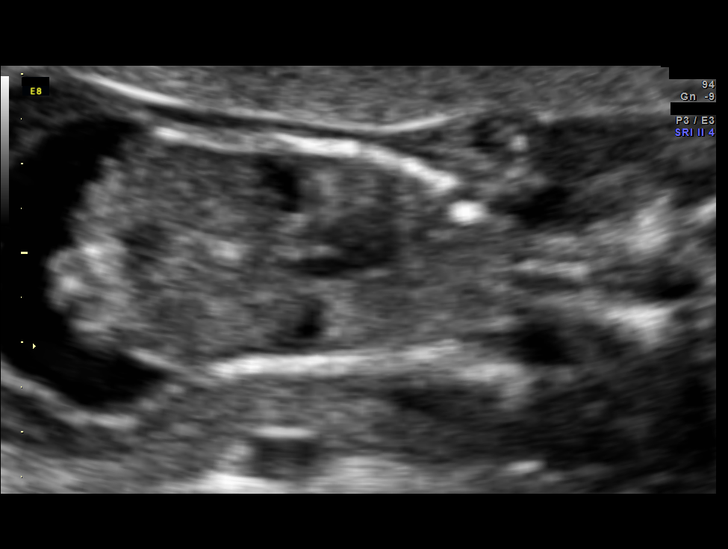
[im 26/26]
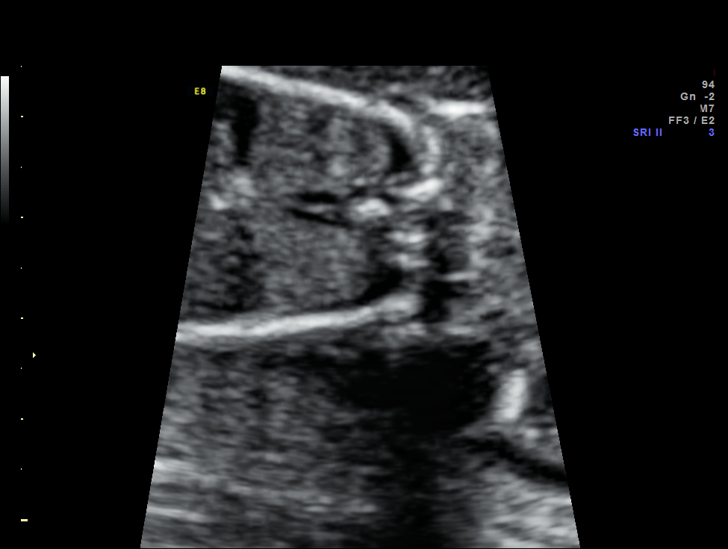

[13 of 26 positions shown; findings below may reference images not displayed]

OBSTETRICS REPORT
                      (Signed Final 02/16/2014 [DATE])

Service(s) Provided

 [HOSPITAL]                                         76815.0
Indications

 Cystic hygroma, hydrops
 Low risk NIPS (female)
 Poor obstetric history: Previous midtrimester loss
 (18 weeks, hygroma, 46 XY)
 19 weeks gestation of pregnancy
 Pregnancy with inconclusive fetal viability
Fetal Evaluation

 Num Of Fetuses:    1
 Fetal Heart Rate:  153                          bpm
 Cardiac Activity:  Observed
 Presentation:      Cephalic
 Placenta:          Anterior, above cervical os

 Amniotic Fluid
 AFI FV:      Subjectively low-normal
Gestational Age

 LMP:           19w 6d        Date:  09/30/13                 EDD:    07/07/14
 Best:          19w 6d     Det. By:  LMP  (09/30/13)          EDD:    07/07/14
Anatomy

 Cranium:          Previously seen        LVOT:              Previously seen
 Fetal Cavum:      Not well visualized    Stomach:           Previously Seen
 Ventricles:       Previously seen        Abdomen:           Ascites
 Choroid Plexus:   Previously seen        Abdominal Wall:    Previously seen
 Cerebellum:       Previously seen        Cord Vessels:      Previously seen
 Posterior Fossa:  Previously seen        Kidneys:           Previously seen
 Nuchal Fold:      Cystic hygroma         Bladder:           Appears normal -
                                                             small
 Face:             Not well visualized    Spine:             Not well visualized
 Lips:             Not well visualized    Lower              See comments
                                          Extremities:
 Heart:            Previously seen        Upper              See comments
                                          Extremities:
 RVOT:             Previously seen
Cervix Uterus Adnexa

 Cervical Length:    3         cm

 Cervix:       Normal appearance by transabdominal scan. Appears
               closed, without funnelling.
Impression

 SIUP at 19+6 weeks
 Viable
 Cystic hygroma; hydrops: massive skin edema; ascites;
 pleural effusions
 Low normal amniotic fluid volume

 Amnio results pending
Recommendations

 Check viability next week

 questions or concerns.

## 2016-04-03 ENCOUNTER — Ambulatory Visit: Payer: 59 | Admitting: Family Medicine

## 2016-04-03 ENCOUNTER — Ambulatory Visit (INDEPENDENT_AMBULATORY_CARE_PROVIDER_SITE_OTHER): Payer: 59 | Admitting: Family Medicine

## 2016-04-03 VITALS — BP 100/70 | HR 63 | Ht 62.0 in | Wt 128.8 lb

## 2016-04-03 DIAGNOSIS — F339 Major depressive disorder, recurrent, unspecified: Secondary | ICD-10-CM | POA: Diagnosis not present

## 2016-04-03 DIAGNOSIS — R51 Headache: Secondary | ICD-10-CM

## 2016-04-03 DIAGNOSIS — R519 Headache, unspecified: Secondary | ICD-10-CM

## 2016-04-03 MED ORDER — SERTRALINE HCL 50 MG PO TABS: 50.0000 mg | ORAL_TABLET | Freq: Every day | ORAL | 3 refills | Status: DC

## 2016-04-03 MED ORDER — CYCLOBENZAPRINE HCL 5 MG PO TABS
5.0000 mg | ORAL_TABLET | Freq: Every day | ORAL | 0 refills | Status: DC
Start: 2016-04-03 — End: 2016-04-29

## 2016-04-03 NOTE — Patient Instructions (Signed)
General Headache Without Cause A headache is pain or discomfort felt around the head or neck area. The specific cause of a headache may not be found. There are many causes and types of headaches. A few common ones are:  Tension headaches.  Migraine headaches.  Cluster headaches.  Chronic daily headaches.  Follow these instructions at home: Watch your condition for any changes. Take these steps to help with your condition: Managing pain  Take over-the-counter and prescription medicines only as told by your health care provider.  Lie down in a dark, quiet room when you have a headache.  If directed, apply ice to the head and neck area: ? Put ice in a plastic bag. ? Place a towel between your skin and the bag. ? Leave the ice on for 20 minutes, 2-3 times per day.  Use a heating pad or hot shower to apply heat to the head and neck area as told by your health care provider.  Keep lights dim if bright lights bother you or make your headaches worse. Eating and drinking  Eat meals on a regular schedule.  Limit alcohol use.  Decrease the amount of caffeine you drink, or stop drinking caffeine. General instructions  Keep all follow-up visits as told by your health care provider. This is important.  Keep a headache journal to help find out what may trigger your headaches. For example, write down: ? What you eat and drink. ? How much sleep you get. ? Any change to your diet or medicines.  Try massage or other relaxation techniques.  Limit stress.  Sit up straight, and do not tense your muscles.  Do not use tobacco products, including cigarettes, chewing tobacco, or e-cigarettes. If you need help quitting, ask your health care provider.  Exercise regularly as told by your health care provider.  Sleep on a regular schedule. Get 7-9 hours of sleep, or the amount recommended by your health care provider. Contact a health care provider if:  Your symptoms are not helped by  medicine.  You have a headache that is different from the usual headache.  You have nausea or you vomit.  You have a fever. Get help right away if:  Your headache becomes severe.  You have repeated vomiting.  You have a stiff neck.  You have a loss of vision.  You have problems with speech.  You have pain in the eye or ear.  You have muscular weakness or loss of muscle control.  You lose your balance or have trouble walking.  You feel faint or pass out.  You have confusion. This information is not intended to replace advice given to you by your health care provider. Make sure you discuss any questions you have with your health care provider. Document Released: 02/06/2005 Document Revised: 07/15/2015 Document Reviewed: 06/01/2014 Elsevier Interactive Patient Education  2017 [employer]  

## 2016-04-03 NOTE — Progress Notes (Signed)
Pre visit review using our clinic review tool, if applicable. No additional management support is needed unless otherwise documented below in the visit note. 

## 2016-04-03 NOTE — Progress Notes (Signed)
Subjective:     Patient ID: Janet Martinez, female   DOB: [birth year], 31 y.o.   MRN: [id number]  HPI   Patient seen with one half week history of headache. Headache has been somewhat intermittent. Mostly diffuse and bilateral. She states this is frequently dull and occasionally sharp. Denies any history of migraine headaches. She has had history of frequent muscle tension in past. She is not aware of any triggering factors other than possibly some mild photophobia. No worsening with activity. She has continued to work. She denies any sinusitis symptoms. No fevers or chills. Non-throbbing quality. Nonexertional. She took one of her mothers Imitrex couple days ago had some vomiting afterwards. Otherwise no vomiting.  She states she is under increased stress recently. She does have history of depression and feels her depression symptoms are increasing. Denies any suicidal ideation. She previously took sertraline which she thought helped.  Past Medical History:  Diagnosis Date  . History of chicken pox   . Medical history non-contributory    Past Surgical History:  Procedure Laterality Date  . NO PAST SURGERIES    . WISDOM TOOTH EXTRACTION      reports that she has never smoked. She does not have any smokeless tobacco history on file. She reports that she does not drink alcohol or use drugs. family history includes Breast cancer in her other; Cancer in her paternal aunt and paternal grandmother; Mental illness in her sister; Miscarriages / Stillbirths in her mother; Seizures in her brother. No Known Allergies   Review of Systems  Constitutional: Negative for appetite change, fever and unexpected weight change.  HENT: Negative for congestion, sinus pain, sinus pressure and sore throat.   Respiratory: Negative for shortness of breath.   Cardiovascular: Negative for chest pain.  Neurological: Positive for headaches.  Psychiatric/Behavioral: Positive for dysphoric mood. Negative for agitation,  self-injury, sleep disturbance and suicidal ideas.       Objective:   Physical Exam  Constitutional: She is oriented to person, place, and time. She appears well-developed and well-nourished.  HENT:  Right Ear: External ear normal.  Left Ear: External ear normal.  Mouth/Throat: Oropharynx is clear and moist.  Neck: Neck supple.  Cardiovascular: Normal rate and regular rhythm.   Pulmonary/Chest: Effort normal and breath sounds normal. No respiratory distress. She has no wheezes. She has no rales.  Musculoskeletal:  Neck is supple. She has some mild trapezius and cervical muscle tenderness bilaterally  Lymphadenopathy:    She has no cervical adenopathy.  Neurological: She is alert and oriented to person, place, and time. No cranial nerve deficit. Coordination normal.  Psychiatric: She has a normal mood and affect. Her behavior is normal. Judgment and thought content normal.  PHQ-9 score of 10.         Assessment:     #1 intermittent headache. Suspect probably tension-type  #2 recurrent depression    Plan:     -Flexeril 5 mg daily at bedtime and reviewed potential side effects -Start back sertraline 50 mgs once daily  -She is currently not established with new primary provider and we recommended follow-up either here or with someone at [facility] clinic in 3 weeks  -Follow-up sooner for any persistent headaches, progressive headaches, new neurologic symptoms, recurrent vomiting, or other concerns   Eulas Post MD [facility] at [facility]

## 2016-04-04 ENCOUNTER — Encounter: Payer: Self-pay | Admitting: Family Medicine

## 2016-04-04 ENCOUNTER — Ambulatory Visit (INDEPENDENT_AMBULATORY_CARE_PROVIDER_SITE_OTHER): Payer: 59 | Admitting: Family Medicine

## 2016-04-04 VITALS — BP 124/82 | HR 72 | Ht 62.0 in | Wt 130.0 lb

## 2016-04-04 DIAGNOSIS — G43909 Migraine, unspecified, not intractable, without status migrainosus: Secondary | ICD-10-CM | POA: Insufficient documentation

## 2016-04-04 DIAGNOSIS — M999 Biomechanical lesion, unspecified: Secondary | ICD-10-CM

## 2016-04-04 DIAGNOSIS — G43701 Chronic migraine without aura, not intractable, with status migrainosus: Secondary | ICD-10-CM

## 2016-04-04 MED ORDER — DIAZEPAM 5 MG PO TABS: 5.0000 mg | ORAL_TABLET | Freq: Two times a day (BID) | ORAL | 1 refills | Status: DC | PRN

## 2016-04-04 NOTE — Patient Instructions (Signed)
Good to see you  Ice 20 minutes 2 times daily. Usually after activity and before bed. I think you will do good.  2 tennis ball in a tube sock and lay on them where head meets neck.  Valium up to 2 times a day if worsening pain  See me again when you need me;)

## 2016-04-04 NOTE — Progress Notes (Signed)
[facility] [place],  [address] Phone: [phone] Subjective:    I'm seeing this patient by the request  of:    CC: Migraine  [id number]  Janet Martinez is a 31 y.o. female coming in with complaint of migraine headaches. Patient has had this for quite some number times. Patient has seen me previously for manipulation and has had some good results previously. Patient states that unfortunately this been going on for 2 days. Patient states that it's chronic, mostly in the lateral and the left side. Seems to go from the temporal area in all the way to her thigh. Not having any significant visual changes but does have photophobia. Patient states that it seems to be unrelenting. Patient has tried multiple different medications with no significant benefit. Denies any radiation of pain, does have some nausea as well as 2 episodes of vomiting. Denies any fevers chills or any abnormal weight loss.     Past Medical History:  Diagnosis Date  . History of chicken pox   . Medical history non-contributory    Past Surgical History:  Procedure Laterality Date  . NO PAST SURGERIES    . WISDOM TOOTH EXTRACTION     Social History   Social History  . Marital status: Married    Spouse name: N/A  . Number of children: N/A  . Years of education: N/A   Social History Main Topics  . Smoking status: Never Smoker  . Smokeless tobacco: Never Used  . Alcohol use No  . Drug use: No  . Sexual activity: Yes   Other Topics Concern  . None   Social History Narrative   ** Merged History Encounter **       ** Data from: 02/08/11 Enc Dept: [facility]       ** Data from: 09/28/11 Enc Dept: [facility]   Married, lives with spouse and 1 dtr, working at [employer]   No Known Allergies Family History  Problem Relation Age of Onset  . Mental illness Sister     anxiety  . Seizures Brother   . Cancer Paternal Grandmother     ovarian  .  Miscarriages / [place] Mother   . Cancer Paternal Aunt     multiple types  . Breast cancer Other     Past medical history, social, surgical and family history all reviewed in electronic medical record.  No pertanent information unless stated regarding to the chief complaint.   Review of Systems:Review of systems updated and as accurate as of 04/04/16  No visual changes, diarrhea, constipation, dizziness, abdominal pain, skin rash, fevers, chills, night sweats, weight loss, swollen lymph nodes, body aches, joint swelling, muscle aches, chest pain, shortness of breath, mood changes.   Objective  Blood pressure 124/82, pulse 72, height 5\' 2"  (1.575 m), weight 130 lb (59 kg). Systems examined below as of 04/04/16   General: No apparent distress alert and oriented x3 mood and affect normal, dressed appropriately.  HEENT: Pupils equal, extraocular movements intact  Respiratory: Patient's speak in full sentences and does not appear short of breath  Cardiovascular: No lower extremity edema, non tender, no erythema  Skin: Warm dry intact with no signs of infection or rash on extremities or on axial skeleton.  Abdomen: Soft nontender  Neuro: Cranial nerves II through XII are intact, neurovascularly intact in all extremities with 2+ DTRs and 2+ pulses.  Lymph: No lymphadenopathy of posterior or anterior cervical chain or axillae  bilaterally.  Gait normal with good balance and coordination.  MSK:  Non tender with full range of motion and good stability and symmetric strength and tone of shoulders, elbows, wrist, hip, knee and ankles bilaterally.  Neck: Inspection unremarkable. No palpable stepoffs. Negative Spurling's maneuver. Cemented in tightness noted over the left side of the neck. Grip strength and sensation normal in bilateral hands Strength good C4 to T1 distribution No sensory change to C4 to T1 Negative Hoffman sign bilaterally Reflexes normal  Osteopathic findings Cervical C2  flexed rotated and side bent left  C4 flexed rotated and side bent left C6 flexed rotated and side bent left T3 extended rotated and side bent left inhaled third rib T9 extended rotated and side bent left L2 flexed rotated and side bent right Sacrum right on right    Impression and Recommendations:     This case required medical decision making of moderate complexity.      Note: This dictation was prepared with Dragon dictation along with smaller phrase technology. Any transcriptional errors that result from this process are unintentional.

## 2016-04-04 NOTE — Assessment & Plan Note (Signed)
Patient was having more of a migraine. Attempted manipulation with fairly good resolution. We discussed posture, ergonomics, given Valium for any breakthrough necessary as a muscle spasm. We discussed triggers and what to potentially avoid. Patient will see me again in 2 weeks if not significantly better or will call if significant worsening symptoms. Otherwise his lungs patient completely resolves can follow-up as needed.

## 2016-04-04 NOTE — Assessment & Plan Note (Signed)
Decision today to treat with OMT was based on Physical Exam  After verbal consent patient was treated with HVLA, ME, FPR techniques in cervical, thoracic, rib lumbar and sacral areas  Patient tolerated the procedure well with improvement in symptoms  Patient given exercises, stretches and lifestyle modifications  See medications in patient instructions if given  Patient will follow up in 2-3 weeks 

## 2016-04-29 ENCOUNTER — Other Ambulatory Visit: Payer: Self-pay | Admitting: Family Medicine

## 2016-05-30 ENCOUNTER — Other Ambulatory Visit: Payer: Self-pay | Admitting: Family Medicine

## 2016-06-28 ENCOUNTER — Other Ambulatory Visit: Payer: Self-pay | Admitting: Family Medicine

## 2016-07-29 ENCOUNTER — Other Ambulatory Visit: Payer: Self-pay | Admitting: Family Medicine

## 2016-07-31 NOTE — Telephone Encounter (Signed)
Refill done.  

## 2016-11-28 NOTE — Progress Notes (Signed)
[place] at [facility] [place], [place], [place] [address] [phone]  Date:  11/30/2016   Name:  Janet Martinez   DOB:  1985/06/14   MRN:  [id number]  PCP:  Darreld Mclean, MD    Chief Complaint: Establish Care   History of Present Illness:  Janet Martinez is a 31 y.o. very pleasant female patient who presents with the following:  Here today as a new patient to establish care She suffered a fetal death at approx 22 weeks back in 2015, and another miscarriage in 2017 Besides her very difficult pregnancy history she is in good health   She and her husband are in the process of being approved as foster parents and she needs me to complete a brief physical form for her She does not have any risk factors or sx of TB She does take zoloft which she started after a fetal loss a couple of years ago.  She no longer feels that she is depressed, but notes that when she tries to stop this medication she does not feel as well, so for the time being she is continuing to take it  She does have one daughter who is nearly 24 yo.   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  Also, she notes that her scalp tends to be very dry and scaly. She has tried all sorts of OTC dandruff shampoos but they do not help  She declines a flu shot Offered labs but she declines today Patient Active Problem List   Diagnosis Date Noted  . Migraine 04/04/2016  . Hydrops fetalis   . Hydrops fetalis in second trimester, antepartum   . 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 Abnormal fetal ultrasound   . Nonallopathic lesion-rib cage 12/02/2013  .  Nonallopathic lesion of cervical region 03/17/2013  . Nonallopathic lesion of lumbosacral region 03/17/2013  . Nonallopathic lesion of thoracic region 03/17/2013    Past Medical History:  Diagnosis Date  . History of chicken pox   . Medical history non-contributory     Past Surgical History:  Procedure Laterality Date  . NO PAST SURGERIES    . WISDOM TOOTH EXTRACTION      Social History  Substance Use Topics  . Smoking status: Never Smoker  . Smokeless tobacco: Never Used  . Alcohol use No    Family History  Problem Relation Age of Onset  . Mental illness Sister        anxiety  . Seizures Brother   . Cancer Paternal Grandmother        ovarian  . Miscarriages / [place] Mother   . Cancer Paternal Aunt        multiple types  . Breast cancer Other     No Known Allergies  Medication list has been reviewed and updated.  Current Outpatient Prescriptions on File Prior to Visit  Medication Sig Dispense Refill  . cyclobenzaprine (FLEXERIL) 5 MG tablet TAKE 1 TABLET (5 MG TOTAL) BY MOUTH AT BEDTIME. 30 tablet 0   No current facility-administered medications on file  prior to visit.     Review of Systems:  As per HPI- otherwise negative.   Physical Examination: Vitals:   11/30/16 1300  BP: 112/72  Pulse: 83  Temp: 98.5 F (36.9 C)  SpO2: 97%   Vitals:   11/30/16 1300  Weight: 130 lb (59 kg)  Height: 5\' 2"  (1.575 m)   Body mass index is 23.78 kg/m. Ideal Body Weight: Weight in (lb) to have BMI = 25: 136.4  GEN: WDWN, NAD, Non-toxic, A & O x 3, slim build, looks well HEENT: Atraumatic, Normocephalic. Neck supple. No masses, No LAD.  Bilateral TM wnl, oropharynx normal.  PEERL,EOMI.   Ears and Nose: No external deformity. CV: RRR, No M/G/R. No JVD. No thrill. No extra heart sounds. PULM: CTA B, no wheezes, crackles, rhonchi. No retractions. No resp. distress. No accessory muscle use. ABD: S, NT, ND. No rebound. No HSM. EXTR: No c/c/e NEURO Normal  gait.  PSYCH: Normally interactive. Conversant. Not depressed or anxious appearing.  Calm demeanor.  She does have a dry and scaly scalp, but no other rashes or eczema She has a likely benign nevus on her right waist- offered to remove today, but she would prefer to do another day   Assessment and Plan: Dry scalp - Plan: Fluocinolone Acetonide Scalp (DERMA-SMOOTHE/FS SCALP) 0.01 % OIL  Benign nevus of skin  Adjustment disorder with mixed anxiety and depressed mood - Plan: sertraline (ZOLOFT) 50 MG tablet, DISCONTINUED: sertraline (ZOLOFT) 50 MG tablet  Suspect a benign mole on her hip- however did advise that we remove it for path since it does stand out to her Refilled her zoloft Completed forms for foster care She will dry derma-smooth for her scalp Plan to visit for a pap at her convenience   Signed Lamar Blinks, MD

## 2016-11-29 ENCOUNTER — Telehealth: Payer: Self-pay

## 2016-11-29 NOTE — Telephone Encounter (Signed)
Pre visit call attempted. Unable to leave message, VM not set up

## 2016-11-30 ENCOUNTER — Encounter: Payer: Self-pay | Admitting: Family Medicine

## 2016-11-30 ENCOUNTER — Ambulatory Visit (INDEPENDENT_AMBULATORY_CARE_PROVIDER_SITE_OTHER): Payer: 59 | Admitting: Family Medicine

## 2016-11-30 VITALS — BP 112/72 | HR 83 | Temp 98.5°F | Ht 62.0 in | Wt 130.0 lb

## 2016-11-30 DIAGNOSIS — F4323 Adjustment disorder with mixed anxiety and depressed mood: Secondary | ICD-10-CM | POA: Diagnosis not present

## 2016-11-30 DIAGNOSIS — R238 Other skin changes: Secondary | ICD-10-CM

## 2016-11-30 DIAGNOSIS — D229 Melanocytic nevi, unspecified: Secondary | ICD-10-CM | POA: Diagnosis not present

## 2016-11-30 MED ORDER — SERTRALINE HCL 50 MG PO TABS: 50.0000 mg | ORAL_TABLET | Freq: Every day | ORAL | 3 refills | Status: DC

## 2016-11-30 MED ORDER — FLUOCINOLONE ACETONIDE SCALP 0.01 % EX OIL: TOPICAL_OIL | CUTANEOUS | 1 refills | Status: DC

## 2016-11-30 NOTE — Patient Instructions (Addendum)
It was a pleasure to meet you today!  Take care, I am glad to see you back to remove the mole at your convenience, and we can do a pap for you when you like  Try the scalp oil for your dry scalp- you can use this up to a few times a week  We can also do labs (cholesterol, etc) for you one of these days

## 2017-03-14 ENCOUNTER — Encounter: Payer: Self-pay | Admitting: Family Medicine

## 2017-03-14 ENCOUNTER — Ambulatory Visit: Payer: Managed Care, Other (non HMO) | Admitting: Family Medicine

## 2017-03-14 VITALS — BP 116/72 | HR 80 | Temp 98.9°F | Wt 126.8 lb

## 2017-03-14 DIAGNOSIS — B350 Tinea barbae and tinea capitis: Secondary | ICD-10-CM

## 2017-03-14 DIAGNOSIS — Z5181 Encounter for therapeutic drug level monitoring: Secondary | ICD-10-CM

## 2017-03-14 DIAGNOSIS — Z13220 Encounter for screening for lipoid disorders: Secondary | ICD-10-CM | POA: Diagnosis not present

## 2017-03-14 DIAGNOSIS — S66911A Strain of unspecified muscle, fascia and tendon at wrist and hand level, right hand, initial encounter: Secondary | ICD-10-CM

## 2017-03-14 DIAGNOSIS — F4321 Adjustment disorder with depressed mood: Secondary | ICD-10-CM

## 2017-03-14 LAB — CBC
HCT: 44.6 % (ref 36.0–46.0)
Hemoglobin: 14.9 g/dL (ref 12.0–15.0)
MCHC: 33.4 g/dL (ref 30.0–36.0)
MCV: 95.9 fl (ref 78.0–100.0)
Platelets: 171 10*3/uL (ref 150.0–400.0)
RBC: 4.65 Mil/uL (ref 3.87–5.11)
RDW: 12.4 % (ref 11.5–15.5)
WBC: 7.5 10*3/uL (ref 4.0–10.5)

## 2017-03-14 LAB — COMPREHENSIVE METABOLIC PANEL
ALT: 21 U/L (ref 0–35)
AST: 21 U/L (ref 0–37)
Albumin: 4.7 g/dL (ref 3.5–5.2)
Alkaline Phosphatase: 52 U/L (ref 39–117)
BILIRUBIN TOTAL: 0.5 mg/dL (ref 0.2–1.2)
BUN: 18 mg/dL (ref 6–23)
CO2: 25 meq/L (ref 19–32)
Calcium: 9.9 mg/dL (ref 8.4–10.5)
Chloride: 102 mEq/L (ref 96–112)
Creatinine, Ser: 0.72 mg/dL (ref 0.40–1.20)
GFR: 99.5 mL/min (ref >60.00)
GLUCOSE: 93 mg/dL (ref 70–99)
Potassium: 4.8 mEq/L (ref 3.5–5.1)
SODIUM: 139 meq/L (ref 135–145)
TOTAL PROTEIN: 7.7 g/dL (ref 6.0–8.3)

## 2017-03-14 LAB — LIPID PANEL
CHOL/HDL RATIO: 2
Cholesterol: 176 mg/dL (ref 0–200)
HDL: 71.4 mg/dL (ref >39.00)
LDL Cholesterol: 91 mg/dL (ref 0–99)
NONHDL: 104.97
Triglycerides: 70 mg/dL (ref 0.0–149.0)
VLDL: 14 mg/dL (ref 0.0–40.0)

## 2017-03-14 MED ORDER — BUPROPION HCL ER (SR) 150 MG PO TB12: 150.0000 mg | ORAL_TABLET | Freq: Two times a day (BID) | ORAL | 6 refills | Status: DC

## 2017-03-14 MED ORDER — TERBINAFINE HCL 250 MG PO TABS: 250.0000 mg | ORAL_TABLET | Freq: Every day | ORAL | 0 refills | Status: DC

## 2017-03-14 NOTE — Progress Notes (Signed)
[place] at [facility] [address], [place], [place] [address] [phone]  Date:  03/14/2017   Name:  Janet Martinez   DOB:  June 09, 1985   MRN:  [id number]  PCP:  Darreld Mclean, MD    Chief Complaint: Wrist Pain (c/o right wrist pain off and on. Pt states that sx's have been getting worse over the past few weeks. )   History of Present Illness:  Janet Martinez is a 32 y.o. very pleasant female patient who presents with the following:  I last saw her in October Here today as a new patient to establish care She suffered a fetal death at approx 22 weeks back in 2015, and another miscarriage in 2017 Besides her very difficult pregnancy history she is in good health  She and her husband are in the process of being approved as foster parents and she needs me to complete a brief physical form for her She does not have any risk factors or sx of TB She does take zoloft which she started after a fetal loss a couple of years ago.  She no longer feels that she is depressed, but notes that when she tries to stop this medication she does not feel as well, so for the time being she is continuing to take it She does have one daughter who is nearly 20 yo.   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 A couple of weeks ago she was trying to install a car seat and it was hurting.  It has continued to bother her off and on since then She is right handed She is not aware of any inciting injury She will sometimes feel the pain coming up her right arm as well No change in her schedule or routine that would bother her wrist- not doing more computer work, etc She may notice numbness and tingling of her right hand during the night only- does not seem to be any particular fingers more than  others.  Not an issue during the morning or other times in the day  She is potentially getting 2 young boys to live with them- 47 and 57 months old, she is already caring for them a couple of days a week. Her 83 yo daughter is doing well  She is taking zoloft which seems to have decreased her sex drive. "my husband is making me talk to you about this."  She has been on zoloft for about 2 years now for depression and it does seem to be working ok.  However, she would be willing to try something else to help wit this SE She has not used anything except for the zoloft for depression Right now her mood is overall quite good She does not tend to have anxiety, more just depression  Finallly, she continues to be bothered by a very dry and itchy scalp.  She has tried numerous OTC products for this, and se tried an rx steroid scalp oil for her at her last visit - no help She would be ok with trying terbinafine for possible tinea capitus, we will check a CBC and CMP today  She is overdue for labs so will do a lipid as well- she is not fasting  She declines  a flu shot today    Patient Active Problem List   Diagnosis Date Noted  . Migraine 04/04/2016  . Hydrops fetalis   . Hydrops fetalis in second trimester, antepartum   . 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 Abnormal fetal ultrasound   . Nonallopathic lesion-rib cage 12/02/2013  . Nonallopathic lesion of cervical region 03/17/2013  . Nonallopathic lesion of lumbosacral region 03/17/2013  . Nonallopathic lesion of thoracic region 03/17/2013    Past Medical History:  Diagnosis Date  . History of chicken pox   . Medical history non-contributory     Past Surgical History:  Procedure Laterality Date  . NO PAST SURGERIES    . WISDOM TOOTH EXTRACTION      Social History   Tobacco Use  . Smoking status: Never Smoker  . Smokeless tobacco: Never Used  Substance Use Topics  . Alcohol use: No  . Drug use: No    Family History  Problem  Relation Age of Onset  . Mental illness Sister        anxiety  . Seizures Brother   . Cancer Paternal Grandmother        ovarian  . Miscarriages / [place] Mother   . Cancer Paternal Aunt        multiple types  . Breast cancer Other     No Known Allergies  Medication list has been reviewed and updated.  Current Outpatient Medications on File Prior to Visit  Medication Sig Dispense Refill  . cyclobenzaprine (FLEXERIL) 5 MG tablet TAKE 1 TABLET (5 MG TOTAL) BY MOUTH AT BEDTIME. 30 tablet 0  . Fluocinolone Acetonide Scalp (DERMA-SMOOTHE/FS SCALP) 0.01 % OIL Apply to scalp up to 3x a week as needed. Leave on for 4 hours or overnight before washing out 118 mL 1  . sertraline (ZOLOFT) 50 MG tablet Take 1 tablet (50 mg total) by mouth daily. 90 tablet 3   No current facility-administered medications on file prior to visit.     Review of Systems:  As per HPI- otherwise negative. No fever or chills No dry skin except for on her scalp No left wrist pain    Physical Examination: Vitals:   03/14/17 0852  BP: 116/72  Pulse: 80  Temp: 98.9 F (37.2 C)  SpO2: 99%   Vitals:   03/14/17 0852  Weight: 126 lb 12.8 oz (57.5 kg)   Body mass index is 23.19 kg/m. Ideal Body Weight:    GEN: WDWN, NAD, Non-toxic, A & O x 3, looks well HEENT: Atraumatic, Normocephalic. Neck supple. No masses, No LAD. She has dry, flaky and scaly skin that is only within the scalp- clearly demarcated.   Ears and Nose: No external deformity. CV: RRR, No M/G/R. No JVD. No thrill. No extra heart sounds. PULM: CTA B, no wheezes, crackles, rhonchi. No retractions. No resp. distress. No accessory muscle use. EXTR: No c/c/e NEURO Normal gait.  PSYCH: Normally interactive. Conversant. Not depressed or anxious appearing.  Calm demeanor.  Right wrist: normal ROM, no tenderness on exam. Negative finkelstein, no pain with wrist flexion or percussion over carpal tunnel   Assessment and Plan: Tinea capitis -  Plan: terbinafine (LAMISIL) 250 MG tablet, DISCONTINUED: terbinafine (LAMISIL) 250 MG tablet  Adjustment disorder with depressed mood - Plan: buPROPion (WELLBUTRIN SR) 150 MG 12 hr tablet, DISCONTINUED: buPROPion (WELLBUTRIN SR) 150 MG 12 hr tablet  Wrist strain, right, initial encounter  Medication monitoring encounter - Plan: CBC, Comprehensive metabolic panel  Screening for hyperlipidemia - Plan:  Lipid panel  Will try daily lamisil for 6 weeks for her scalp.  CBC and CMP pending today Will taper off zoloft and onto wellbutin for her mood sx She will try an OTC wrist brace for 1-2 weeks and let me know if not helpful Will be in touch with her labs asap   Signed Lamar Blinks, MD

## 2017-03-14 NOTE — Patient Instructions (Addendum)
It was good to see you today! For your right wrist try using a splint at night (and during the day when convenient) for about 2 weeks.  Let me know if this is not helpful for you  Take a 1/2 zoloft for 1 week, then you can stop this medication and start on the wellbutrin.  Take the wellbutrin once a day for 3 days and then go to twice a day  We are going to try terbinafine for your scalp- take it once a day for 6 weeks. We will check your blood count and liver function today to make sure you are ok to use the terbinafine

## 2017-03-15 ENCOUNTER — Other Ambulatory Visit: Payer: Self-pay | Admitting: Emergency Medicine

## 2017-03-19 ENCOUNTER — Encounter: Payer: Self-pay | Admitting: Family Medicine

## 2017-03-19 ENCOUNTER — Ambulatory Visit (INDEPENDENT_AMBULATORY_CARE_PROVIDER_SITE_OTHER): Payer: Managed Care, Other (non HMO) | Admitting: Family Medicine

## 2017-03-19 VITALS — BP 102/64 | HR 68 | Temp 98.4°F | Ht 62.0 in | Wt 129.0 lb

## 2017-03-19 DIAGNOSIS — R21 Rash and other nonspecific skin eruption: Secondary | ICD-10-CM

## 2017-03-19 DIAGNOSIS — L210 Seborrhea capitis: Secondary | ICD-10-CM | POA: Diagnosis not present

## 2017-03-19 DIAGNOSIS — S161XXS Strain of muscle, fascia and tendon at neck level, sequela: Secondary | ICD-10-CM

## 2017-03-19 DIAGNOSIS — L299 Pruritus, unspecified: Secondary | ICD-10-CM | POA: Diagnosis not present

## 2017-03-19 MED ORDER — CYCLOBENZAPRINE HCL 5 MG PO TABS: 5.0000 mg | ORAL_TABLET | Freq: Every day | ORAL | 0 refills | Status: DC

## 2017-03-19 NOTE — Patient Instructions (Addendum)
This may be a drug reaction to the terbinafine. Stop taking this- you may want to use a non -drowsy antihistamine such as claritin or zyrtec for a few days, and you can use benadryl at night.  I am also going to refill your flexeril but please do not combine with benadryl due to risk of excessive sedation  At this time you do not show any sign of dangerous allergic reaction but if you have any fever or other systemic symptoms, mouth or genital lesions please seek care right away   We will refer you to dermatology to look at your scalp

## 2017-03-19 NOTE — Progress Notes (Signed)
[facility] [place], [place], [place] [address] [phone]  Date:  03/19/2017   Name:  Janet Martinez   DOB:  [birth year]   MRN:  [id number]  PCP:  Darreld Mclean, MD    Chief Complaint: Rash (c/o itchy rash that started axillary and has now spread. )   History of Present Illness:  Janet Martinez is a 32 y.o. very pleasant female patient who presents with the following:  Generally healthy young woman here today with concern of a rash which may be related to use of a new type of deodorant She started using this a few weeks ago- it seemed to cause a little irritation in her axilla but she did not notice any real rash until yesterday-  However yesterday it got much worse and is itchy She did hurt her neck this past Saturday- today is Monday.   She has had problems with her back for a few years and will use flexeril as needed- she could use a refill of this today  She is taking terbinafine which we started just a few days ago for itchy flaky scalp.   She did not start the wellbutrin as of yet   We will refer her to derm for her scalp   Patient Active Problem List   Diagnosis Date Noted  . Migraine 04/04/2016  . Hydrops fetalis   . Hydrops fetalis in second trimester, antepartum   . 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 Abnormal fetal ultrasound   . Nonallopathic lesion-rib cage 12/02/2013  . Nonallopathic lesion of cervical region 03/17/2013  . Nonallopathic lesion of lumbosacral region 03/17/2013  . Nonallopathic lesion of thoracic region 03/17/2013    Past Medical History:  Diagnosis Date  . History of chicken pox   . Medical history non-contributory     Past Surgical History:  Procedure Laterality Date  . NO PAST SURGERIES    . WISDOM TOOTH EXTRACTION      Social History   Tobacco Use  . Smoking status: Never Smoker  . Smokeless tobacco: Never Used  Substance Use Topics  . Alcohol use: No  .  Drug use: No    Family History  Problem Relation Age of Onset  . Mental illness Sister        anxiety  . Seizures Brother   . Cancer Paternal Grandmother        ovarian  . Miscarriages / [place] Mother   . Cancer Paternal Aunt        multiple types  . Breast cancer Other     No Known Allergies  Medication list has been reviewed and updated.  Current Outpatient Medications on File Prior to Visit  Medication Sig Dispense Refill  . buPROPion (WELLBUTRIN SR) 150 MG 12 hr tablet Take 1 tablet (150 mg total) by mouth 2 (two) times daily. 60 tablet 6  . Fluocinolone Acetonide Scalp (DERMA-SMOOTHE/FS SCALP) 0.01 % OIL Apply to scalp up to 3x a week as needed. Leave on for 4 hours or overnight before washing out 118 mL 1  . sertraline (ZOLOFT) 50 MG tablet Take 1 tablet (50 mg total) by mouth daily. 90 tablet 3  . terbinafine (LAMISIL) 250 MG tablet Take 1 tablet (250 mg total) by mouth daily. For 6 weeks 45 tablet 0   No current facility-administered medications on file prior to visit.     Review of Systems:  As per HPI- otherwise negative  No fever or chills No CP or SOB No difficulty breathing or lip/ tongue swelling No mouth or genital lesions  Physical Examination: Vitals:   03/19/17 1000  BP: 102/64  Pulse: 68  Temp: 98.4 F (36.9 C)  SpO2: 99%   Vitals:   03/19/17 1000  Weight: 129 lb (58.5 kg)  Height: 5\' 2"  (1.575 m)   Body mass index is 23.59 kg/m. Ideal Body Weight: Weight in (lb) to have BMI = 25: 136.4  GEN: WDWN, NAD, Non-toxic, A & O x 3, slim build, looks well  HEENT: Atraumatic, Normocephalic. Neck supple. No masses, No LAD.  Bilateral TM wnl, oropharynx normal.  PEERL,EOMI.   Ears and Nose: No external deformity. CV: RRR, No M/G/R. No JVD. No thrill. No extra heart sounds. PULM: CTA B, no wheezes, crackles, rhonchi. No retractions. No resp. distress. No accessory muscle use. ABD: S, NT, ND, +BS. No rebound. No HSM. EXTR: No c/c/e NEURO Normal  gait.  PSYCH: Normally interactive. Conversant. Not depressed or anxious appearing.  Calm demeanor.  She has bilateral axillary inflammation and also a milder reaction in the bilateral inguinal folds- this appears to be a drug reaction    Assessment and Plan: Rash  Strain of neck muscle, sequela - Plan: cyclobenzaprine (FLEXERIL) 5 MG tablet  Itchy scalp - Plan: Ambulatory referral to Dermatology  Flaky scalp - Plan: Ambulatory referral to Dermatology  Suspect a drug reaction due to appearance of rash- will have her stop terbinafine She will do a patch test on her forearm with the new deodorant to see if this is actually to blame Referral to dermatology for her scalp issue  This may be a drug reaction to the terbinafine. Stop taking this- you may want to use a non -drowsy antihistamine such as claritin or zyrtec for a few days, and you can use benadryl at night.  I am also going to refill your flexeril but please do not combine with benadryl due to risk of excessive sedation  At this time you do not show any sign of dangerous allergic reaction but if you have any fever or other systemic symptoms, mouth or genital lesions please seek care right away    Signed Lamar Blinks, MD

## 2017-04-05 ENCOUNTER — Telehealth: Payer: Self-pay

## 2017-04-05 MED ORDER — BUPROPION HCL ER (XL) 300 MG PO TB24: 300.0000 mg | ORAL_TABLET | Freq: Every day | ORAL | 3 refills | Status: DC

## 2017-04-05 NOTE — Telephone Encounter (Signed)
Wellbutrin causing problems pt shakes before taking it also no compassion   Please advise

## 2017-04-05 NOTE — Telephone Encounter (Signed)
She started on the wellbutrin 10-14 days ago Over the last couple of days she feels like she is not herself.  She will feel "panicky" before her morning dose.  She felt like she was doing fine until the last couple of days when she felt more down and discouraged.  However no SI or HI Discussed options with her She would like to continue on the wellbutrin for now, but is not sure if it will work out for her. She may want to try the once a day XL formulation as she seems to have sx prior to taking her am dose. I sent in this rx for her. She will keep me posted about how she is doing

## 2017-04-29 ENCOUNTER — Other Ambulatory Visit: Payer: Self-pay | Admitting: Family Medicine

## 2017-04-29 DIAGNOSIS — S161XXS Strain of muscle, fascia and tendon at neck level, sequela: Secondary | ICD-10-CM

## 2017-05-24 ENCOUNTER — Telehealth: Payer: Self-pay | Admitting: Emergency Medicine

## 2017-05-24 DIAGNOSIS — F4323 Adjustment disorder with mixed anxiety and depressed mood: Secondary | ICD-10-CM

## 2017-05-24 MED ORDER — SERTRALINE HCL 50 MG PO TABS: 50.0000 mg | ORAL_TABLET | Freq: Every day | ORAL | 1 refills | Status: DC

## 2017-05-24 NOTE — Telephone Encounter (Signed)
Pt request refill on sertraline (ZOLOFT) 50 MG tablet. Pt was on buPROPion (WELLBUTRIN SR) 150 MG 12 hr tablet but stopped taking it about 3-4 weeks ago due to the medication making her feel really agitated and not happy. Pt states that since switching back to sertraline she now feels like herself again.  I have sent a refill of sertraline (ZOLOFT) 50 MG tablet to pt's pharmacy as requested.

## 2017-05-30 ENCOUNTER — Ambulatory Visit: Payer: Managed Care, Other (non HMO) | Admitting: Family Medicine

## 2017-05-30 NOTE — Progress Notes (Signed)
[facility] [place], [place] [address] Phone: [phone] Subjective:     CC: Neck pain.  [id number]  Janet Martinez is a 32 y.o. female coming in with complaint of neck pain. She has had some spasming in her neck since last visit. She believes that this is stress induced. She is having headaches daily which she wakes up with. She used to get headaches later in the day. She continues to use IBU and essential oils.  Has responded to osteopathic manipulation previously.  Does have some scapular dyskinesis.  Some mild increase in stress recently which seems to be with exacerbating factors.     Past Medical History:  Diagnosis Date  . History of chicken pox   . Medical history non-contributory    Past Surgical History:  Procedure Laterality Date  . NO PAST SURGERIES    . WISDOM TOOTH EXTRACTION     Social History   Socioeconomic History  . Marital status: Married    Spouse name: Not on file  . Number of children: Not on file  . Years of education: Not on file  . Highest education level: Not on file  Occupational History  . Not on file  Social Needs  . Financial resource strain: Not on file  . Food insecurity:    Worry: Not on file    Inability: Not on file  . Transportation needs:    Medical: Not on file    Non-medical: Not on file  Tobacco Use  . Smoking status: Never Smoker  . Smokeless tobacco: Never Used  Substance and Sexual Activity  . Alcohol use: No  . Drug use: No  . Sexual activity: Yes  Lifestyle  . Physical activity:    Days per week: Not on file    Minutes per session: Not on file  . Stress: Not on file  Relationships  . Social connections:    Talks on phone: Not on file    Gets together: Not on file    Attends religious service: Not on file    Active member of club or organization: Not on file    Attends meetings of clubs or organizations: Not on file    Relationship status: Not on file  Other  Topics Concern  . Not on file  Social History Narrative   ** Merged History Encounter **       ** Data from: 02/08/11 Enc Dept: [facility]       ** Data from: 09/28/11 Enc Dept: [facility]   Married, lives with spouse and 1 dtr, working at [employer]   No Known Allergies Family History  Problem Relation Age of Onset  . Mental illness Sister        anxiety  . Seizures Brother   . Cancer Paternal Grandmother        ovarian  . Miscarriages / [place] Mother   . Cancer Paternal Aunt        multiple types  . Breast cancer Other      Past medical history, social, surgical and family history all reviewed in electronic medical record.  No pertanent information unless stated regarding to the chief complaint.   Review of Systems:Review of systems updated and as accurate as of 05/31/17  No headache, visual changes, nausea, vomiting, diarrhea, constipation, dizziness, abdominal pain, skin rash, fevers, chills, night sweats, weight loss, swollen lymph nodes, body aches, joint swelling, muscle aches, chest pain, shortness of breath, mood changes.  Objective  Blood pressure 114/84, pulse 70, height 5\' 2"  (9.191 m), weight 120 lb 9.6 oz (54.7 kg), SpO2 99 %. Systems examined below as of 05/31/17   General: No apparent distress alert and oriented x3 mood and affect normal, dressed appropriately.  HEENT: Pupils equal, extraocular movements intact  Respiratory: Patient's speak in full sentences and does not appear short of breath  Cardiovascular: No lower extremity edema, non tender, no erythema  Skin: Warm dry intact with no signs of infection or rash on extremities or on axial skeleton.  Abdomen: Soft nontender  Neuro: Cranial nerves II through XII are intact, neurovascularly intact in all extremities with 2+ DTRs and 2+ pulses.  Lymph: No lymphadenopathy of posterior or anterior cervical chain or axillae bilaterally.  Gait normal with good balance and coordination.  MSK:   Non tender with full range of motion and good stability and symmetric strength and tone of shoulders, elbows, wrist, hip, knee and ankles bilaterally.  Neck: Inspection unremarkable. No palpable stepoffs. Negative Spurling's maneuver. Full neck range of motion Grip strength and sensation normal in bilateral hands Strength good C4 to T1 distribution No sensory change to C4 to T1 Negative Hoffman sign bilaterally Reflexes normal Tightness of the trapezius muscles bilaterally.  Osteopathic findings Cervical C2 flexed rotated and side bent right C4 flexed rotated and side bent left C6 flexed rotated and side bent left T3 extended rotated and side bent right inhaled third rib T9 extended rotated and side bent left     Impression and Recommendations:     This case required medical decision making of moderate complexity.      Note: This dictation was prepared with Dragon dictation along with smaller phrase technology. Any transcriptional errors that result from this process are unintentional.

## 2017-05-31 ENCOUNTER — Ambulatory Visit (INDEPENDENT_AMBULATORY_CARE_PROVIDER_SITE_OTHER): Payer: BLUE CROSS/BLUE SHIELD | Admitting: Family Medicine

## 2017-05-31 ENCOUNTER — Encounter: Payer: Self-pay | Admitting: Family Medicine

## 2017-05-31 VITALS — BP 114/84 | HR 70 | Ht 62.0 in | Wt 120.6 lb

## 2017-05-31 DIAGNOSIS — M999 Biomechanical lesion, unspecified: Secondary | ICD-10-CM

## 2017-05-31 DIAGNOSIS — M542 Cervicalgia: Secondary | ICD-10-CM | POA: Diagnosis not present

## 2017-05-31 MED ORDER — TIZANIDINE HCL 4 MG PO TABS: 4.0000 mg | ORAL_TABLET | Freq: Four times a day (QID) | ORAL | 0 refills | Status: DC | PRN

## 2017-05-31 NOTE — Patient Instructions (Signed)
Great to see you  Flonase each nostril daily for 2 weeks.  Zanaflex if you need it Put purseon the other shoulder See me again in 4 weeks if needed

## 2017-05-31 NOTE — Assessment & Plan Note (Signed)
Neck pain.  Discussed icing regimen and home exercises.  Discussed which activities to do which wants to avoid.  Patient does have some tightness and has had this for quite some time.  Has responded well to osteopathic manipulation.  Follow-up with me again in 6 weeks

## 2017-05-31 NOTE — Assessment & Plan Note (Signed)
Decision today to treat with OMT was based on Physical Exam  After verbal consent patient was treated with HVLA, ME, FPR techniques in cervical, thoracic, rib areas  Patient tolerated the procedure well with improvement in symptoms  Patient given exercises, stretches and lifestyle modifications  See medications in patient instructions if given  Patient will follow up in 4-6 weeks 

## 2017-07-01 NOTE — Progress Notes (Deleted)
[name] [place], [place] [address] Phone: [phone] Subjective:    I'm seeing this patient by the request  of:    CC:   [id number]  Devynn Hessler is a 32 y.o. female coming in with complaint of ***  Onset-  Location Duration-  Character- Aggravating factors- Reliving factors-  Therapies tried-  Severity-     Past Medical History:  Diagnosis Date  . History of chicken pox   . Medical history non-contributory    Past Surgical History:  Procedure Laterality Date  . NO PAST SURGERIES    . WISDOM TOOTH EXTRACTION     Social History   Socioeconomic History  . Marital status: Married    Spouse name: Not on file  . Number of children: Not on file  . Years of education: Not on file  . Highest education level: Not on file  Occupational History  . Not on file  Social Needs  . Financial resource strain: Not on file  . Food insecurity:    Worry: Not on file    Inability: Not on file  . Transportation needs:    Medical: Not on file    Non-medical: Not on file  Tobacco Use  . Smoking status: Never Smoker  . Smokeless tobacco: Never Used  Substance and Sexual Activity  . Alcohol use: No  . Drug use: No  . Sexual activity: Yes  Lifestyle  . Physical activity:    Days per week: Not on file    Minutes per session: Not on file  . Stress: Not on file  Relationships  . Social connections:    Talks on phone: Not on file    Gets together: Not on file    Attends religious service: Not on file    Active member of club or organization: Not on file    Attends meetings of clubs or organizations: Not on file    Relationship status: Not on file  Other Topics Concern  . Not on file  Social History Narrative   ** Merged History Encounter **       ** Data from: 02/08/11 Enc Dept: [facility]       ** Data from: 09/28/11 Enc Dept: [facility]   Married, lives with spouse and 1 dtr, working at [employer]    No Known Allergies Family History  Problem Relation Age of Onset  . Mental illness Sister        anxiety  . Seizures Brother   . Cancer Paternal Grandmother        ovarian  . Miscarriages / [place] Mother   . Cancer Paternal Aunt        multiple types  . Breast cancer Other      Past medical history, social, surgical and family history all reviewed in electronic medical record.  No pertanent information unless stated regarding to the chief complaint.   Review of Systems:Review of systems updated and as accurate as of 07/01/17  No headache, visual changes, nausea, vomiting, diarrhea, constipation, dizziness, abdominal pain, skin rash, fevers, chills, night sweats, weight loss, swollen lymph nodes, body aches, joint swelling, muscle aches, chest pain, shortness of breath, mood changes.   Objective  There were no vitals taken for this visit. Systems examined below as of 07/01/17   General: No apparent distress alert and oriented x3 mood and affect normal, dressed appropriately.  HEENT: Pupils equal, extraocular movements intact  Respiratory: Patient's speak in full  sentences and does not appear short of breath  Cardiovascular: No lower extremity edema, non tender, no erythema  Skin: Warm dry intact with no signs of infection or rash on extremities or on axial skeleton.  Abdomen: Soft nontender  Neuro: Cranial nerves II through XII are intact, neurovascularly intact in all extremities with 2+ DTRs and 2+ pulses.  Lymph: No lymphadenopathy of posterior or anterior cervical chain or axillae bilaterally.  Gait normal with good balance and coordination.  MSK:  Non tender with full range of motion and good stability and symmetric strength and tone of shoulders, elbows, wrist, hip, knee and ankles bilaterally.     Impression and Recommendations:     This case required medical decision making of moderate complexity.      Note: This dictation was prepared with Dragon  dictation along with smaller phrase technology. Any transcriptional errors that result from this process are unintentional.

## 2017-07-02 ENCOUNTER — Ambulatory Visit: Payer: BLUE CROSS/BLUE SHIELD | Admitting: Family Medicine

## 2017-07-02 DIAGNOSIS — Z0289 Encounter for other administrative examinations: Secondary | ICD-10-CM

## 2017-10-11 ENCOUNTER — Encounter: Payer: Self-pay | Admitting: Family Medicine

## 2017-10-11 ENCOUNTER — Ambulatory Visit (INDEPENDENT_AMBULATORY_CARE_PROVIDER_SITE_OTHER): Payer: BLUE CROSS/BLUE SHIELD | Admitting: Family Medicine

## 2017-10-11 VITALS — BP 98/68 | HR 70 | Temp 97.5°F | Resp 18 | Wt 129.0 lb

## 2017-10-11 DIAGNOSIS — G43701 Chronic migraine without aura, not intractable, with status migrainosus: Secondary | ICD-10-CM | POA: Diagnosis not present

## 2017-10-11 MED ORDER — PROMETHAZINE HCL 25 MG/ML IJ SOLN
25.0000 mg | Freq: Once | INTRAMUSCULAR | Status: AC
  Administered 2017-10-11: 25 mg via INTRAMUSCULAR

## 2017-10-11 MED ORDER — KETOROLAC TROMETHAMINE 30 MG/ML IJ SOLN
30.0000 mg | Freq: Once | INTRAMUSCULAR | Status: AC
  Administered 2017-10-11: 30 mg via INTRAMUSCULAR

## 2017-10-11 MED ORDER — RIZATRIPTAN BENZOATE 10 MG PO TABS: 10.0000 mg | ORAL_TABLET | ORAL | 1 refills | Status: DC | PRN

## 2017-10-11 NOTE — Progress Notes (Signed)
p 

## 2017-10-11 NOTE — Patient Instructions (Signed)
Let us know if you need anything.  Heat (pad or rice pillow in microwave) over affected area, 10-15 minutes twice daily.   EXERCISES RANGE OF MOTION (ROM) AND STRETCHING EXERCISES  These exercises may help you when beginning to rehabilitate your issue. In order to successfully resolve your symptoms, you must improve your posture. These exercises are designed to help reduce the forward-head and rounded-shoulder posture which contributes to this condition. Your symptoms may resolve with or without further involvement from your physician, physical therapist or athletic trainer. While completing these exercises, remember:   Restoring tissue flexibility helps normal motion to return to the joints. This allows healthier, less painful movement and activity.  An effective stretch should be held for at least 20 seconds, although you may need to begin with shorter hold times for comfort.  A stretch should never be painful. You should only feel a gentle lengthening or release in the stretched tissue.  Do not do any stretch or exercise that you cannot tolerate.  STRETCH- Axial Extensors  Lie on your back on the floor. You may bend your knees for comfort. Place a rolled-up hand towel or dish towel, about 2 inches in diameter, under the part of your head that makes contact with the floor.  Gently tuck your chin, as if trying to make a "double chin," until you feel a gentle stretch at the base of your head.  Hold 15-20 seconds. Repeat 2-3 times. Complete this exercise 1 time per day.   STRETCH - Axial Extension   Stand or sit on a firm surface. Assume a good posture: chest up, shoulders drawn back, abdominal muscles slightly tense, knees unlocked (if standing) and feet hip width apart.  Slowly retract your chin so your head slides back and your chin slightly lowers. Continue to look straight ahead.  You should feel a gentle stretch in the back of your head. Be certain not to feel an aggressive  stretch since this can cause headaches later.  Hold for 15-20 seconds. Repeat 2-3 times. Complete this exercise 1 time per day.  STRETCH - Cervical Side Bend   Stand or sit on a firm surface. Assume a good posture: chest up, shoulders drawn back, abdominal muscles slightly tense, knees unlocked (if standing) and feet hip width apart.  Without letting your nose or shoulders move, slowly tip your right / left ear to your shoulder until your feel a gentle stretch in the muscles on the opposite side of your neck.  Hold 15-20 seconds. Repeat 2-3 times. Complete this exercise 1-2 times per day.  STRETCH - Cervical Rotators   Stand or sit on a firm surface. Assume a good posture: chest up, shoulders drawn back, abdominal muscles slightly tense, knees unlocked (if standing) and feet hip width apart.  Keeping your eyes level with the ground, slowly turn your head until you feel a gentle stretch along the back and opposite side of your neck.  Hold 15-20 seconds. Repeat 2-3 times. Complete this exercise 1-2 times per day.  RANGE OF MOTION - Neck Circles   Stand or sit on a firm surface. Assume a good posture: chest up, shoulders drawn back, abdominal muscles slightly tense, knees unlocked (if standing) and feet hip width apart.  Gently roll your head down and around from the back of one shoulder to the back of the other. The motion should never be forced or painful.  Repeat the motion 10-20 times, or until you feel the neck muscles relax and loosen. Repeat  2-3 times. Complete the exercise 1-2 times per day. STRENGTHENING EXERCISES - Cervical Strain and Sprain These exercises may help you when beginning to rehabilitate your injury. They may resolve your symptoms with or without further involvement from your physician, physical therapist, or athletic trainer. While completing these exercises, remember:   Muscles can gain both the endurance and the strength needed for everyday activities through  controlled exercises.  Complete these exercises as instructed by your physician, physical therapist, or athletic trainer. Progress the resistance and repetitions only as guided.  You may experience muscle soreness or fatigue, but the pain or discomfort you are trying to eliminate should never worsen during these exercises. If this pain does worsen, stop and make certain you are following the directions exactly. If the pain is still present after adjustments, discontinue the exercise until you can discuss the trouble with your clinician.  STRENGTH - Cervical Flexors, Isometric  Face a wall, standing about 6 inches away. Place a small pillow, a ball about 6-8 inches in diameter, or a folded towel between your forehead and the wall.  Slightly tuck your chin and gently push your forehead into the soft object. Push only with mild to moderate intensity, building up tension gradually. Keep your jaw and forehead relaxed.  Hold 10 to 20 seconds. Keep your breathing relaxed.  Release the tension slowly. Relax your neck muscles completely before you start the next repetition. Repeat 2-3 times. Complete this exercise 1 time per day.  STRENGTH- Cervical Lateral Flexors, Isometric   Stand about 6 inches away from a wall. Place a small pillow, a ball about 6-8 inches in diameter, or a folded towel between the side of your head and the wall.  Slightly tuck your chin and gently tilt your head into the soft object. Push only with mild to moderate intensity, building up tension gradually. Keep your jaw and forehead relaxed.  Hold 10 to 20 seconds. Keep your breathing relaxed.  Release the tension slowly. Relax your neck muscles completely before you start the next repetition. Repeat 2-3 times. Complete this exercise 1 time per day.  STRENGTH - Cervical Extensors, Isometric   Stand about 6 inches away from a wall. Place a small pillow, a ball about 6-8 inches in diameter, or a folded towel between the back  of your head and the wall.  Slightly tuck your chin and gently tilt your head back into the soft object. Push only with mild to moderate intensity, building up tension gradually. Keep your jaw and forehead relaxed.  Hold 10 to 20 seconds. Keep your breathing relaxed.  Release the tension slowly. Relax your neck muscles completely before you start the next repetition. Repeat 2-3 times. Complete this exercise 1 time per day.  POSTURE AND BODY MECHANICS CONSIDERATIONS Keeping correct posture when sitting, standing or completing your activities will reduce the stress put on different body tissues, allowing injured tissues a chance to heal and limiting painful experiences. The following are general guidelines for improved posture. Your physician or physical therapist will provide you with any instructions specific to your needs. While reading these guidelines, remember:  The exercises prescribed by your provider will help you have the flexibility and strength to maintain correct postures.  The correct posture provides the optimal environment for your joints to work. All of your joints have less wear and tear when properly supported by a spine with good posture. This means you will experience a healthier, less painful body.  Correct posture must be practiced with  all of your activities, especially prolonged sitting and standing. Correct posture is as important when doing repetitive low-stress activities (typing) as it is when doing a single heavy-load activity (lifting).  PROLONGED STANDING WHILE SLIGHTLY LEANING FORWARD When completing a task that requires you to lean forward while standing in one place for a long time, place either foot up on a stationary 2- to 4-inch high object to help maintain the best posture. When both feet are on the ground, the low back tends to lose its slight inward curve. If this curve flattens (or becomes too large), then the back and your other joints will experience too  much stress, fatigue more quickly, and can cause pain.   RESTING POSITIONS Consider which positions are most painful for you when choosing a resting position. If you have pain with flexion-based activities (sitting, bending, stooping, squatting), choose a position that allows you to rest in a less flexed posture. You would want to avoid curling into a fetal position on your side. If your pain worsens with extension-based activities (prolonged standing, working overhead), avoid resting in an extended position such as sleeping on your stomach. Most people will find more comfort when they rest with their spine in a more neutral position, neither too rounded nor too arched. Lying on a non-sagging bed on your side with a pillow between your knees, or on your back with a pillow under your knees will often provide some relief. Keep in mind, being in any one position for a prolonged period of time, no matter how correct your posture, can still lead to stiffness.  WALKING Walk with an upright posture. Your ears, shoulders, and hips should all line up. OFFICE WORK When working at a desk, create an environment that supports good, upright posture. Without extra support, muscles fatigue and lead to excessive strain on joints and other tissues.  CHAIR:  A chair should be able to slide under your desk when your back makes contact with the back of the chair. This allows you to work closely.  The chair's height should allow your eyes to be level with the upper part of your monitor and your hands to be slightly lower than your elbows.  Body position: ? Your feet should make contact with the floor. If this is not possible, use a foot rest. ? Keep your ears over your shoulders. This will reduce stress on your neck and low back.

## 2017-10-11 NOTE — Progress Notes (Signed)
Chief Complaint  Patient presents with  . Migraine     Janet Martinez is a 32 y.o. female here for evaluation of an acute headache.  Duration: 1 day Laterality: left Associated symptoms: numbness around mouth and slurred speech Hx of migraine: Yes, has been on Imitrex in past but AE's prevented continued use. Has been under more stress as of late. +Neck pain, +hx of this, noncompliant with home stretches/exercises.  ROS:  Neuro: +HA MSK: +neck pain  Past Medical History:  Diagnosis Date  . History of chicken pox   . Medical history non-contributory     BP 98/68 (BP Location: Left Arm, Patient Position: Sitting, Cuff Size: Normal)   Pulse 70   Temp (!) 97.5 F (36.4 C) (Oral)   Resp 18   Wt 129 lb (58.5 kg)   SpO2 97%   BMI 23.59 kg/m  Gen: awake, alert, appearing stated age Heart: RRR Lungs: CTAB, no accessory muscle use Neuro: CN2-12 grossly intact, fluent and goal-oriented speech, DTR's equal and symmetric in UE's and LE's MSK: 5/5 strength throughout, +TTP over cervical paraspinal musculature and occipital triangle region b/l Psych: Age appropriate judgment and insight, normal affect and mood  Chronic migraine without aura with status migrainosus, not intractable - Plan: rizatriptan (MAXALT) 10 MG tablet, promethazine (PHENERGAN) injection 25 mg, ketorolac (TORADOL) 30 MG/ML injection 30 mg  Toradol and Phenergan for abortive therapy. Start Maxalt prn. Will hold off on prophylactic medication as she has only had 2 migraines in past mo. F/u prn. The pt voiced understanding and agreement to the plan.  [place], DO 10/11/17 2:59 PM

## 2017-10-12 ENCOUNTER — Other Ambulatory Visit: Payer: Self-pay | Admitting: Family Medicine

## 2017-10-12 MED ORDER — PREDNISONE 5 MG (21) PO TBPK: ORAL_TABLET | ORAL | 0 refills | Status: DC

## 2017-10-12 NOTE — Progress Notes (Signed)
[facility]pack called in. Pt's mother notified.

## 2018-03-05 ENCOUNTER — Other Ambulatory Visit: Payer: Self-pay

## 2018-03-05 DIAGNOSIS — F4323 Adjustment disorder with mixed anxiety and depressed mood: Secondary | ICD-10-CM

## 2018-03-05 MED ORDER — SERTRALINE HCL 50 MG PO TABS
50.0000 mg | ORAL_TABLET | Freq: Every day | ORAL | 1 refills | Status: DC
Start: 2018-03-05 — End: 2018-04-18

## 2018-03-09 ENCOUNTER — Ambulatory Visit (INDEPENDENT_AMBULATORY_CARE_PROVIDER_SITE_OTHER): Payer: BLUE CROSS/BLUE SHIELD | Admitting: Internal Medicine

## 2018-03-09 ENCOUNTER — Encounter: Payer: Self-pay | Admitting: Internal Medicine

## 2018-03-09 VITALS — BP 94/66 | HR 66 | Temp 98.2°F | Ht 62.0 in | Wt 125.0 lb

## 2018-03-09 DIAGNOSIS — J989 Respiratory disorder, unspecified: Secondary | ICD-10-CM

## 2018-03-09 DIAGNOSIS — J329 Chronic sinusitis, unspecified: Secondary | ICD-10-CM | POA: Diagnosis not present

## 2018-03-09 DIAGNOSIS — R509 Fever, unspecified: Secondary | ICD-10-CM

## 2018-03-09 LAB — POC INFLUENZA A&B (BINAX/QUICKVUE)
INFLUENZA A, POC: NEGATIVE
INFLUENZA B, POC: NEGATIVE

## 2018-03-09 MED ORDER — AMOXICILLIN-POT CLAVULANATE 875-125 MG PO TABS: 1.0000 | ORAL_TABLET | Freq: Two times a day (BID) | ORAL | 0 refills | Status: DC

## 2018-03-09 NOTE — Patient Instructions (Addendum)
This  Is most likely the viral illness your children have  No signs of pneumonia an sinus sx   Should resolve with time and decongestants afrin nose spray for 2-3 days and saline .   But if pain persistent progressive can add antibiotic ( usually not helpful early in illness)     Sinusitis, Adult Sinusitis is inflammation of your sinuses. Sinuses are hollow spaces in the bones around your face. Your sinuses are located:  Around your eyes.  In the middle of your forehead.  Behind your nose.  In your cheekbones. Mucus normally drains out of your sinuses. When your nasal tissues become inflamed or swollen, mucus can become trapped or blocked. This allows bacteria, viruses, and fungi to grow, which leads to infection. Most infections of the sinuses are caused by a virus. Sinusitis can develop quickly. It can last for up to 4 weeks (acute) or for more than 12 weeks (chronic). Sinusitis often develops after a cold. What are the causes? This condition is caused by anything that creates swelling in the sinuses or stops mucus from draining. This includes:  Allergies.  Asthma.  Infection from bacteria or viruses.  Deformities or blockages in your nose or sinuses.  Abnormal growths in the nose (nasal polyps).  Pollutants, such as chemicals or irritants in the air.  Infection from fungi (rare). What increases the risk? You are more likely to develop this condition if you:  Have a weak body defense system (immune system).  Do a lot of swimming or diving.  Overuse nasal sprays.  Smoke. What are the signs or symptoms? The main symptoms of this condition are pain and a feeling of pressure around the affected sinuses. Other symptoms include:  Stuffy nose or congestion.  Thick drainage from your nose.  Swelling and warmth over the affected sinuses.  Headache.  Upper toothache.  A cough that may get worse at night.  Extra mucus that collects in the throat or the back of the  nose (postnasal drip).  Decreased sense of smell and taste.  Fatigue.  A fever.  Sore throat.  Bad breath. How is this diagnosed? This condition is diagnosed based on:  Your symptoms.  Your medical history.  A physical exam.  Tests to find out if your condition is acute or chronic. This may include: ? Checking your nose for nasal polyps. ? Viewing your sinuses using a device that has a light (endoscope). ? Testing for allergies or bacteria. ? Imaging tests, such as an MRI or CT scan. In rare cases, a bone biopsy may be done to rule out more serious types of fungal sinus disease. How is this treated? Treatment for sinusitis depends on the cause and whether your condition is chronic or acute.  If caused by a virus, your symptoms should go away on their own within 10 days. You may be given medicines to relieve symptoms. They include: ? Medicines that shrink swollen nasal passages (topical intranasal decongestants). ? Medicines that treat allergies (antihistamines). ? A spray that eases inflammation of the nostrils (topical intranasal corticosteroids). ? Rinses that help get rid of thick mucus in your nose (nasal saline washes).  If caused by bacteria, your health care provider may recommend waiting to see if your symptoms improve. Most bacterial infections will get better without antibiotic medicine. You may be given antibiotics if you have: ? A severe infection. ? A weak immune system.  If caused by narrow nasal passages or nasal polyps, you may need to  have surgery. Follow these instructions at home: Medicines  Take, use, or apply over-the-counter and prescription medicines only as told by your health care provider. These may include nasal sprays.  If you were prescribed an antibiotic medicine, take it as told by your health care provider. Do not stop taking the antibiotic even if you start to feel better. Hydrate and humidify   Drink enough fluid to keep your urine  pale yellow. Staying hydrated will help to thin your mucus.  Use a cool mist humidifier to keep the humidity level in your home above 50%.  Inhale steam for 10-15 minutes, 3-4 times a day, or as told by your health care provider. You can do this in the bathroom while a hot shower is running.  Limit your exposure to cool or dry air. Rest  Rest as much as possible.  Sleep with your head raised (elevated).  Make sure you get enough sleep each night. General instructions   Apply a warm, moist washcloth to your face 3-4 times a day or as told by your health care provider. This will help with discomfort.  Wash your hands often with soap and water to reduce your exposure to germs. If soap and water are not available, use hand sanitizer.  Do not smoke. Avoid being around people who are smoking (secondhand smoke).  Keep all follow-up visits as told by your health care provider. This is important. Contact a health care provider if:  You have a fever.  Your symptoms get worse.  Your symptoms do not improve within 10 days. Get help right away if:  You have a severe headache.  You have persistent vomiting.  You have severe pain or swelling around your face or eyes.  You have vision problems.  You develop confusion.  Your neck is stiff.  You have trouble breathing. Summary  Sinusitis is soreness and inflammation of your sinuses. Sinuses are hollow spaces in the bones around your face.  This condition is caused by nasal tissues that become inflamed or swollen. The swelling traps or blocks the flow of mucus. This allows bacteria, viruses, and fungi to grow, which leads to infection.  If you were prescribed an antibiotic medicine, take it as told by your health care provider. Do not stop taking the antibiotic even if you start to feel better.  Keep all follow-up visits as told by your health care provider. This is important. This information is not intended to replace advice  given to you by your health care provider. Make sure you discuss any questions you have with your health care provider. Document Released: 02/06/2005 Document Revised: 07/09/2017 Document Reviewed: 07/09/2017 [employer] Interactive Patient Education  2019 [employer].

## 2018-03-09 NOTE — Progress Notes (Signed)
Chief Complaint  Patient presents with  . Cough  . Fever  . Facial Pain    HPI: Janet Martinez 33 y.o. .   Patient comes in today for [facility] Saturday clinic for  new problem evaluation.   Onset   2 days ago  after  Her 2 young  children have been sick w  fever 5 days ago  Neg flu screen cough   Baby same thing  And now she has had similar sx for 2 days    But has a really bad headache and left face pain .   Eyes burning.     Cough no sob  Family had flu vaccine .  Nasal dranage about the same  Kid age 28 and 2?   No rash   vomitng dc  Feels achy   ROS: See pertinent positives and negatives per HPI.  Past Medical History:  Diagnosis Date  . History of chicken pox   . Medical history non-contributory     Family History  Problem Relation Age of Onset  . Mental illness Sister        anxiety  . Seizures Brother   . Cancer Paternal Grandmother        ovarian  . Miscarriages / [place] Mother   . Cancer Paternal Aunt        multiple types  . Breast cancer Other     Social History   Socioeconomic History  . Marital status: Married    Spouse name: Not on file  . Number of children: Not on file  . Years of education: Not on file  . Highest education level: Not on file  Occupational History  . Not on file  Social Needs  . Financial resource strain: Not on file  . Food insecurity:    Worry: Not on file    Inability: Not on file  . Transportation needs:    Medical: Not on file    Non-medical: Not on file  Tobacco Use  . Smoking status: Never Smoker  . Smokeless tobacco: Never Used  Substance and Sexual Activity  . Alcohol use: No  . Drug use: No  . Sexual activity: Yes  Lifestyle  . Physical activity:    Days per week: Not on file    Minutes per session: Not on file  . Stress: Not on file  Relationships  . Social connections:    Talks on phone: Not on file    Gets together: Not on file    Attends religious service: Not on file    Active member of club or  organization: Not on file    Attends meetings of clubs or organizations: Not on file    Relationship status: Not on file  Other Topics Concern  . Not on file  Social History Narrative   ** Merged History Encounter **       ** Data from: 02/08/11 Enc Dept: [facility]       ** Data from: 09/28/11 Enc Dept: [facility]   Married, lives with spouse and 1 dtr, working at [employer]    Outpatient Medications Prior to Visit  Medication Sig Dispense Refill  . sertraline (ZOLOFT) 50 MG tablet Take 1 tablet (50 mg total) by mouth daily. 90 tablet 1  . predniSONE (STERAPRED UNI-PAK 21 TAB) 5 MG (21) TBPK tablet Follow instructions on package. 21 tablet 0  . rizatriptan (MAXALT) 10 MG tablet Take 1 tablet (10 mg total) by mouth as needed  for migraine. May repeat in 2 hours if needed 10 tablet 1  . tiZANidine (ZANAFLEX) 4 MG tablet Take 1 tablet (4 mg total) by mouth every 6 (six) hours as needed for muscle spasms. 30 tablet 0   No facility-administered medications prior to visit.      EXAM:  BP 94/66 (BP Location: Left Arm, Patient Position: Sitting, Cuff Size: Normal)   Pulse 66   Temp 98.2 F (36.8 C) (Oral)   Ht 5\' 2"  (1.575 m)   Wt 125 lb (56.7 kg)   SpO2 98%   BMI 22.86 kg/m   Body mass index is 22.86 kg/m. WDWN in NAD  quiet respirations; mildly congested   Non toxic   moderately ill . HEENT: Normocephalic ;atraumatic , Eyes;  PERRL, EOMs  Full, lids and conjunctiva clear,,Ears: no deformities, canals nl, TM landmarks normal, Nose: no deformity or discharge but congested;face non tender Mouth : OP clear without lesion or edema . Neck: Supple without adenopathy or masses or bruits Chest:  Clear to A   without wheezes rales or rhonchi CV:  S1-S2 no gallops or murmurs peripheral perfusion is normal Skin :nl perfusion and no acute rashes   Lab Results  Component Value Date   WBC 7.5 03/14/2017   HGB 14.9 03/14/2017   HCT 44.6 03/14/2017   PLT 171.0 03/14/2017    GLUCOSE 93 03/14/2017   CHOL 176 03/14/2017   TRIG 70.0 03/14/2017   HDL 71.40 03/14/2017   LDLCALC 91 03/14/2017   ALT 21 03/14/2017   AST 21 03/14/2017   NA 139 03/14/2017   K 4.8 03/14/2017   CL 102 03/14/2017   CREATININE 0.72 03/14/2017   BUN 18 03/14/2017   CO2 25 03/14/2017   BP Readings from Last 3 Encounters:  03/09/18 94/66  10/11/17 98/68  05/31/17 114/84  poct flu neg screen [facility]   ASSESSMENT AND PLAN:  Discussed the following assessment and plan:  Respiratory illness with fever - Plan: POC Influenza A&B (Binax test)  Sinusitis, unspecified chronicity, unspecified location Viral resp illness  With sinus involvement .    At this time would hold on antibiotic use  And use decongestant modalities and if  persistent or progressive can add antibiotic  Printed rx .  Counseled. Hydration and alarm sx  -Patient advised to return or notify health care team  if  new concerns arise.  Patient Instructions  This  Is most likely the viral illness your children have  No signs of pneumonia an sinus sx   Should resolve with time and decongestants afrin nose spray for 2-3 days and saline .   But if pain persistent progressive can add antibiotic ( usually not helpful early in illness)     Sinusitis, Adult Sinusitis is inflammation of your sinuses. Sinuses are hollow spaces in the bones around your face. Your sinuses are located:  Around your eyes.  In the middle of your forehead.  Behind your nose.  In your cheekbones. Mucus normally drains out of your sinuses. When your nasal tissues become inflamed or swollen, mucus can become trapped or blocked. This allows bacteria, viruses, and fungi to grow, which leads to infection. Most infections of the sinuses are caused by a virus. Sinusitis can develop quickly. It can last for up to 4 weeks (acute) or for more than 12 weeks (chronic). Sinusitis often develops after a cold. What are the causes? This condition is caused by  anything that creates swelling in the sinuses or stops mucus  from draining. This includes:  Allergies.  Asthma.  Infection from bacteria or viruses.  Deformities or blockages in your nose or sinuses.  Abnormal growths in the nose (nasal polyps).  Pollutants, such as chemicals or irritants in the air.  Infection from fungi (rare). What increases the risk? You are more likely to develop this condition if you:  Have a weak body defense system (immune system).  Do a lot of swimming or diving.  Overuse nasal sprays.  Smoke. What are the signs or symptoms? The main symptoms of this condition are pain and a feeling of pressure around the affected sinuses. Other symptoms include:  Stuffy nose or congestion.  Thick drainage from your nose.  Swelling and warmth over the affected sinuses.  Headache.  Upper toothache.  A cough that may get worse at night.  Extra mucus that collects in the throat or the back of the nose (postnasal drip).  Decreased sense of smell and taste.  Fatigue.  A fever.  Sore throat.  Bad breath. How is this diagnosed? This condition is diagnosed based on:  Your symptoms.  Your medical history.  A physical exam.  Tests to find out if your condition is acute or chronic. This may include: ? Checking your nose for nasal polyps. ? Viewing your sinuses using a device that has a light (endoscope). ? Testing for allergies or bacteria. ? Imaging tests, such as an MRI or CT scan. In rare cases, a bone biopsy may be done to rule out more serious types of fungal sinus disease. How is this treated? Treatment for sinusitis depends on the cause and whether your condition is chronic or acute.  If caused by a virus, your symptoms should go away on their own within 10 days. You may be given medicines to relieve symptoms. They include: ? Medicines that shrink swollen nasal passages (topical intranasal decongestants). ? Medicines that treat allergies  (antihistamines). ? A spray that eases inflammation of the nostrils (topical intranasal corticosteroids). ? Rinses that help get rid of thick mucus in your nose (nasal saline washes).  If caused by bacteria, your health care provider may recommend waiting to see if your symptoms improve. Most bacterial infections will get better without antibiotic medicine. You may be given antibiotics if you have: ? A severe infection. ? A weak immune system.  If caused by narrow nasal passages or nasal polyps, you may need to have surgery. Follow these instructions at home: Medicines  Take, use, or apply over-the-counter and prescription medicines only as told by your health care provider. These may include nasal sprays.  If you were prescribed an antibiotic medicine, take it as told by your health care provider. Do not stop taking the antibiotic even if you start to feel better. Hydrate and humidify   Drink enough fluid to keep your urine pale yellow. Staying hydrated will help to thin your mucus.  Use a cool mist humidifier to keep the humidity level in your home above 50%.  Inhale steam for 10-15 minutes, 3-4 times a day, or as told by your health care provider. You can do this in the bathroom while a hot shower is running.  Limit your exposure to cool or dry air. Rest  Rest as much as possible.  Sleep with your head raised (elevated).  Make sure you get enough sleep each night. General instructions   Apply a warm, moist washcloth to your face 3-4 times a day or as told by your health care provider. This  will help with discomfort.  Wash your hands often with soap and water to reduce your exposure to germs. If soap and water are not available, use hand sanitizer.  Do not smoke. Avoid being around people who are smoking (secondhand smoke).  Keep all follow-up visits as told by your health care provider. This is important. Contact a health care provider if:  You have a fever.  Your  symptoms get worse.  Your symptoms do not improve within 10 days. Get help right away if:  You have a severe headache.  You have persistent vomiting.  You have severe pain or swelling around your face or eyes.  You have vision problems.  You develop confusion.  Your neck is stiff.  You have trouble breathing. Summary  Sinusitis is soreness and inflammation of your sinuses. Sinuses are hollow spaces in the bones around your face.  This condition is caused by nasal tissues that become inflamed or swollen. The swelling traps or blocks the flow of mucus. This allows bacteria, viruses, and fungi to grow, which leads to infection.  If you were prescribed an antibiotic medicine, take it as told by your health care provider. Do not stop taking the antibiotic even if you start to feel better.  Keep all follow-up visits as told by your health care provider. This is important. This information is not intended to replace advice given to you by your health care provider. Make sure you discuss any questions you have with your health care provider. Document Released: 02/06/2005 Document Revised: 07/09/2017 Document Reviewed: 07/09/2017 Elsevier Interactive Patient Education  2019 Austin K. Varetta Chavers M.D.

## 2018-03-15 ENCOUNTER — Ambulatory Visit: Payer: Self-pay

## 2018-03-15 NOTE — Telephone Encounter (Signed)
Patient seen last week at Saturday clinic. Currently taking augmentin. Requesting ear drops. Please advise.

## 2018-03-15 NOTE — Telephone Encounter (Signed)
Patient needing appointment? Per new antibiotic proto calls.

## 2018-03-15 NOTE — Telephone Encounter (Signed)
Why don't we have her see me tomorrow in sat clinic.  I am not sure what kind of ear drops she might need or what is going on

## 2018-03-15 NOTE — Telephone Encounter (Signed)
Advised Patient's mother-Robin who has been in contact with me about this that patient needs to call to schedule appt at [facility] for Saturday clinic.

## 2018-03-15 NOTE — Telephone Encounter (Signed)
Out going call to Patient who complains of feeling worse  After starting antibiotics for sinus infection.  Patient is in the middle of taking Aug mentin 875mg .  Patient states she feels like she Transitioning into something else.Feels very congested.    Reports that her right ear is hurting.  State that she feels like she is in a cloud .  States she cant hear out of her right ear.  Denies fever.  If   Dr.  Lorelei Pont would like to call something in  [facility]/ [place] is her pharmacy of choice.    Reason for Disposition . [1] Taking antibiotics > 24 hours AND [2] symptoms WORSE  Answer Assessment - Initial Assessment Questions 1. INFECTION: "What infection is the antibiotic being given for?"      Sinus infection 2. ANTIBIOTIC: "What antibiotic are you taking" "How many times per day?"     ammoxillan -  3. DURATION: "When was the antibiotic started?"     Still on itg has 8 left.  Taking it 2 a day 4. MAIN CONCERN OR SYMPTOM:  "What is your main concern right now?"     Ear pain  Congestion cant eve hear out of right ear 5. BETTER-SAME-WORSE: "Are you getting better, staying the same, or getting worse compared to when you first started the antibiotics?" If getting worse, ask: "In what way?"      Transitioning into something esle 6. FEVER: "Do you have a fever?" If so, ask: "What is your temperature, how was it measured, and when did it start?"      denies 7. SYMPTOMS: "Are there any other symptoms you're concerned about?" If so, ask: "When did it start?"     Right ear pain.   8. FOLLOW-UP APPOINTMENT: "Do you have a follow-up appointment with your doctor?"     no  Protocols used: INFECTION ON ANTIBIOTIC FOLLOW-UP CALL-A-AH

## 2018-03-16 DIAGNOSIS — J018 Other acute sinusitis: Secondary | ICD-10-CM | POA: Diagnosis not present

## 2018-04-15 NOTE — Progress Notes (Addendum)
[facility] [place], [place], [place] [address] [phone]  Date:  04/18/2018   Name:  Janet Martinez   DOB:  1985-05-31   MRN:  [id number]  PCP:  Darreld Mclean, MD    Chief Complaint: Medication Refill   History of Present Illness:  Janet Martinez is a 33 y.o. very pleasant female patient who presents with the following:  Generally healthy young woman (with the exception of obstetric mishaps), here today to discuss her medication She did suffer a miscarriage which required a D&C in 07-Jun-2015, and another fetal death which required induction of labor in 06-07-14 She does have 1 living daughter who is now 68 years old She is doing well  I last saw her in January 2019 She has been working towards adoption of other children She has 2 foster sons, 63 and 2 yo; they have been though a lot of trauma, and caring for them as a challenge Chany has been working for [employer], but she is now staying home with the kids  Over the holidays she felt like she was really not doing well.  She was feeling very overwhelmed She has started doubling up on her zolofrt from 50 - 100 mg She feels like this helps her a lot, has improved her mood and decrease her irritability No suicidal or homicidal ideation She has been under a lot of pressure, feels like she is handling it better with increase in Zoloft  Her last pap was in Jun 07, 2014; we will update for her today Never had an abnormal Pap We did screening labs over a year ago, she declines to repeat today.  She is young and healthy snacking this is reasonable  Declines a flu shot  She also mentions that her right hand seem to be numb when she woke up this morning.  The sensation returned to normal, but then it went numb again while she was driving.  She notes some pain in the wrist, and the arm just feels a bit sore. She is using her hands a lot right now in caring for and also lifting  her children No other neurologic symptoms The hand is not currently numb  Patient Active Problem List   Diagnosis Date Noted  . Neck pain 05/31/2017  . Migraine 04/04/2016  . Hydrops fetalis   . Hydrops fetalis in second trimester, antepartum   . 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 Abnormal fetal ultrasound   . Nonallopathic lesion-rib cage 12/02/2013  . Nonallopathic lesion of cervical region 03/17/2013  . Nonallopathic lesion of lumbosacral region 03/17/2013  . Nonallopathic lesion of thoracic region 03/17/2013    Past Medical History:  Diagnosis Date  . History of chicken pox   . Medical history non-contributory     Past Surgical History:  Procedure Laterality Date  . NO PAST SURGERIES    . WISDOM TOOTH EXTRACTION      Social History   Tobacco Use  . Smoking status: Never Smoker  . Smokeless tobacco: Never Used  Substance Use Topics  . Alcohol use: No  . Drug use: No    Family History  Problem Relation Age of Onset  . Mental illness Sister        anxiety  . Seizures Brother   . Cancer Paternal Grandmother        ovarian  . Miscarriages / [place] Mother   . Cancer Paternal Aunt  multiple types  . Breast cancer Other     No Known Allergies  Medication list has been reviewed and updated.  Current Outpatient Medications on File Prior to Visit  Medication Sig Dispense Refill  . sertraline (ZOLOFT) 50 MG tablet Take 1 tablet (50 mg total) by mouth daily. 90 tablet 1   No current facility-administered medications on file prior to visit.     Review of Systems:  As per HPI- otherwise negative. No fever or chills, no chest pain or shortness of breath No breast changes or concerns No significant family history of breast cancer LMP was 2 to 3 weeks ago Physical Examination: Vitals:   04/18/18 1133  BP: 112/70  Pulse: 67  Resp: 16  SpO2: 98%   Vitals:   04/18/18 1133  Weight: 123 lb (55.8 kg)  Height: 5\' 2"  (1.575 m)   Body mass index  is 22.5 kg/m. Ideal Body Weight: Weight in (lb) to have BMI = 25: 136.4  GEN: WDWN, NAD, Non-toxic, A & O x 3, normal weight, looks well HEENT: Atraumatic, Normocephalic. Neck supple. No masses, No LAD.  Bilateral TM wnl, oropharynx normal.  PEERL,EOMI.   Ears and Nose: No external deformity. CV: RRR, No M/G/R. No JVD. No thrill. No extra heart sounds. PULM: CTA B, no wheezes, crackles, rhonchi. No retractions. No resp. distress. No accessory muscle use. ABD: S, NT, ND. No rebound. No HSM. EXTR: No c/c/e NEURO Normal gait.  Normal movement and strength of all limbs PSYCH: Normally interactive. Conversant. Not depressed or anxious appearing.  Calm demeanor.  Pelvic: normal, no vaginal lesions or discharge. Uterus normal, no CMT, no adnexal tendereness or masses Bilateral hands display normal grip strength, sensation, perfusion.  I am able to cause pain by tapping on the median nerve or flexing the wrist.  Assessment and Plan: Adjustment disorder with mixed anxiety and depressed mood - Plan: sertraline (ZOLOFT) 100 MG tablet  Screening for cervical cancer - Plan: Cytology - PAP  Carpal tunnel syndrome of right wrist  Pap smear collected today Kayde has increased her Zoloft 200 mg, but seems to be helping her.  Certainly okay to continue this dose, I refilled for her today Suspect carpal tunnel syndrome of her right wrist.  Recommend that she wear a extension wrist splints at night, and during the day if practical. Offered to do further evaluation today, such as plain films of her neck.  She declines, does not feel that her wrist is a very significant issue-she does happened to mention it. I asked her to let me know if her wrist does not get better within a week or 2, and certainly seek care right away if any unusual neurologic symptoms  Signed Lamar Blinks, MD  3/2, addendum Received her Pap  Results for orders placed or performed in visit on 04/18/18  Cytology - PAP  Result  Value Ref Range   Adequacy      Satisfactory for evaluation  endocervical/transformation zone component PRESENT.   Diagnosis      NEGATIVE FOR INTRAEPITHELIAL LESIONS OR MALIGNANCY.   HPV NOT DETECTED    Material Submitted CervicoVaginal Pap [ThinPrep Imaged]    Letter to patient

## 2018-04-18 ENCOUNTER — Ambulatory Visit (INDEPENDENT_AMBULATORY_CARE_PROVIDER_SITE_OTHER): Payer: BLUE CROSS/BLUE SHIELD | Admitting: Family Medicine

## 2018-04-18 ENCOUNTER — Other Ambulatory Visit (HOSPITAL_COMMUNITY)
Admission: RE | Admit: 2018-04-18 | Discharge: 2018-04-18 | Disposition: A | Discharge status: Home / Self Care | Payer: BLUE CROSS/BLUE SHIELD | Source: Ambulatory Visit | Attending: Obstetrics and Gynecology | Admitting: Obstetrics and Gynecology

## 2018-04-18 ENCOUNTER — Encounter: Payer: Self-pay | Admitting: Family Medicine

## 2018-04-18 VITALS — BP 112/70 | HR 67 | Resp 16 | Ht 62.0 in | Wt 123.0 lb

## 2018-04-18 DIAGNOSIS — Z124 Encounter for screening for malignant neoplasm of cervix: Secondary | ICD-10-CM | POA: Insufficient documentation

## 2018-04-18 DIAGNOSIS — F4323 Adjustment disorder with mixed anxiety and depressed mood: Secondary | ICD-10-CM

## 2018-04-18 DIAGNOSIS — G5601 Carpal tunnel syndrome, right upper limb: Secondary | ICD-10-CM

## 2018-04-18 MED ORDER — SERTRALINE HCL 100 MG PO TABS: 100.0000 mg | ORAL_TABLET | Freq: Every day | ORAL | 3 refills | Status: DC

## 2018-04-18 NOTE — Patient Instructions (Signed)
It was great to see you today!  I will be in touch with your pap asap I sent in the higher dose of zoloft for you.  Let me know if you don't feel like this is working ok for you- we can go up more if necessary   I think your hand numbness is likely carpal tunnel I would recommend an OTC wrist splint first; wear this at night.  You can also wear during the day when practical  If this does not help in a week or two, or if you have any other unusual symptoms please let me know or otherwise seek care asap

## 2018-04-22 LAB — CYTOLOGY - PAP
DIAGNOSIS: NEGATIVE
HPV: NOT DETECTED

## 2018-04-23 ENCOUNTER — Encounter: Payer: Self-pay | Admitting: Family Medicine

## 2018-04-23 ENCOUNTER — Ambulatory Visit (INDEPENDENT_AMBULATORY_CARE_PROVIDER_SITE_OTHER): Payer: BLUE CROSS/BLUE SHIELD | Admitting: Family Medicine

## 2018-04-23 VITALS — BP 108/78 | HR 68 | Ht 62.0 in | Wt 124.0 lb

## 2018-04-23 DIAGNOSIS — M542 Cervicalgia: Secondary | ICD-10-CM | POA: Diagnosis not present

## 2018-04-23 DIAGNOSIS — M999 Biomechanical lesion, unspecified: Secondary | ICD-10-CM | POA: Diagnosis not present

## 2018-04-23 MED ORDER — TIZANIDINE HCL 4 MG PO TABS: 4.0000 mg | ORAL_TABLET | Freq: Every evening | ORAL | 2 refills | Status: AC

## 2018-04-23 NOTE — Patient Instructions (Addendum)
Great to see you  Janet Martinez is your friend Duexis 3 times a day for 3 days muscle relaxer at night (zanaflex) See me again in 3-4 weeks

## 2018-04-23 NOTE — Assessment & Plan Note (Signed)
Decision today to treat with OMT was based on Physical Exam  After verbal consent patient was treated with HVLA, ME, FPR techniques in cervical, thoracic, lumbar and sacral areas  Patient tolerated the procedure well with improvement in symptoms  Patient given exercises, stretches and lifestyle modifications  See medications in patient instructions if given  Patient will follow up in 4-6 weeks 

## 2018-04-23 NOTE — Progress Notes (Signed)
[facility] [name],  [address] Phone: [phone] Subjective:      CC: Neck pain follow-up  [id number]  Janet Martinez is a 33 y.o. female coming in with complaint of neck pain. Last seen on 05/31/2017. Patient states that last week she was having tingling in the right arm with driving. Sensation subsided but the next day she went to grab a zip loc bag and her traps when into spasm. Patient has been having spasm now for 2 days. Is here for OMT to manage her neck pain.  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  More stress recently.    Past Medical History:  Diagnosis Date  . History of chicken pox   . Medical history non-contributory    Past Surgical History:  Procedure Laterality Date  . NO PAST SURGERIES    . WISDOM TOOTH EXTRACTION     Social History   Socioeconomic History  . Marital status: Married    Spouse name: Not on file  . Number of children: Not on file  . Years of education: Not on file  . Highest education level: Not on file  Occupational History  . Not on file  Social Needs  . Financial resource strain: Not on file  . Food insecurity:    Worry: Not on file    Inability: Not on file  . Transportation needs:    Medical: Not on file    Non-medical: Not on file  Tobacco Use  . Smoking status: Never Smoker  . Smokeless tobacco: Never Used  Substance and Sexual Activity  . Alcohol use: No  . Drug use: No  . Sexual activity: Yes  Lifestyle  . Physical activity:    Days per week: Not on file    Minutes per session: Not on file  . Stress: Not on file  Relationships  . Social connections:    Talks on phone: Not on file    Gets together: Not on file    Attends religious service: Not on file    Active member of club or organization: Not on file    Attends meetings of clubs or organizations: Not on file    Relationship status: Not on file  Other Topics Concern  . Not on file    Social History Narrative   ** Merged History Encounter **       ** Data from: 02/08/11 Enc Dept: [facility]       ** Data from: 09/28/11 Enc Dept: [facility]   Married, lives with spouse and 1 dtr, working at [employer]   No Known Allergies Family History  Problem Relation Age of Onset  . Mental illness Sister        anxiety  . Seizures Brother   . Cancer Paternal Grandmother        ovarian  . Miscarriages / [place] Mother   . Cancer Paternal Aunt        multiple types  . Breast cancer Other          Current Outpatient Medications (Other):  .  sertraline (ZOLOFT) 100 MG tablet, Take 1 tablet (100 mg total) by mouth daily. Marland Kitchen  tiZANidine (ZANAFLEX) 4 MG tablet, Take 1 tablet (4 mg total) by mouth Nightly for 10 days.    Past medical history, social, surgical and family history all reviewed in electronic medical record.  No pertanent information unless stated regarding to the chief complaint.  Review of Systems:  No headache, visual changes, nausea, vomiting, diarrhea, constipation, dizziness, abdominal pain, skin rash, fevers, chills, night sweats, weight loss, swollen lymph nodes, body aches, joint swelling, , chest pain, shortness of breath, mood changes.  Positive muscle aches  Objective  Blood pressure 108/78, pulse 68, height 5\' 2"  (1.575 m), weight 124 lb (56.2 kg), SpO2 98 %.   General: No apparent distress alert and oriented x3 mood and affect normal, dressed appropriately.  HEENT: Pupils equal, extraocular movements intact  Respiratory: Patient's speak in full sentences and does not appear short of breath  Cardiovascular: No lower extremity edema, non tender, no erythema  Skin: Warm dry intact with no signs of infection or rash on extremities or on axial skeleton.  Abdomen: Soft nontender  Neuro: Cranial nerves II through XII are intact, neurovascularly intact in all extremities with 2+ DTRs and 2+ pulses.  Lymph: No lymphadenopathy of  posterior or anterior cervical chain or axillae bilaterally.  Gait normal with good balance and coordination.  MSK:  Non tender with full range of motion and good stability and symmetric strength and tone of shoulders, elbows, wrist, hip, knee and ankles bilaterally.  Neck exam shows the patient does have some loss of lordosis.  Decreased range of motion especially with left-sided rotation and sidebending of 5 to 10 degrees.  Negative Spurling's.  Significant tightness of the left trapezius.  Neurovascularly intact distally with full strength of the upper extremities.  Deep tendon reflexes intact.  Osteopathic findings C3 flexed rotated and side bent left C7 flexed rotated and side bent left T3 extended rotated and side bent left inhaled third rib T5 extended rotated and side bent left L2 flexed rotated and side bent right Sacrum right on right     Impression and Recommendations:     This case required medical decision making of moderate complexity. The above documentation has been reviewed and is accurate and complete Janet Pulley, DO       Note: This dictation was prepared with Dragon dictation along with smaller phrase technology. Any transcriptional errors that result from this process are unintentional.

## 2018-04-23 NOTE — Assessment & Plan Note (Signed)
Discussed with patient at great length about icing regimen, home exercises, which activities of doing which wants to avoid.  Patient did not want any type of injections today.  Zanaflex given the.  Short course of anti-inflammatories.  I do believe that there is some psychosocial aspect that is also contributing to some of the pains.  Discussed possible talk with patient at this in greater length which patient did not seem ready to discuss.  Patient made aware that we are here if she needs anything.  Follow-up again in 4 to 6 weeks

## 2018-05-22 ENCOUNTER — Ambulatory Visit: Payer: BLUE CROSS/BLUE SHIELD | Admitting: Family Medicine

## 2018-06-25 ENCOUNTER — Ambulatory Visit: Payer: BLUE CROSS/BLUE SHIELD | Admitting: Family Medicine

## 2018-06-25 NOTE — Progress Notes (Deleted)
[facility] [place],  [address] Phone: [phone] Subjective:    I'm seeing this patient by the request  of:    CC:   [id number]  Janet Martinez is a 33 y.o. female coming in with complaint of ***  Onset-  Location Duration-  Character- Aggravating factors- Reliving factors-  Therapies tried-  Severity-     Past Medical History:  Diagnosis Date  . History of chicken pox   . Medical history non-contributory    Past Surgical History:  Procedure Laterality Date  . NO PAST SURGERIES    . WISDOM TOOTH EXTRACTION     Social History   Socioeconomic History  . Marital status: Married    Spouse name: Not on file  . Number of children: Not on file  . Years of education: Not on file  . Highest education level: Not on file  Occupational History  . Not on file  Social Needs  . Financial resource strain: Not on file  . Food insecurity:    Worry: Not on file    Inability: Not on file  . Transportation needs:    Medical: Not on file    Non-medical: Not on file  Tobacco Use  . Smoking status: Never Smoker  . Smokeless tobacco: Never Used  Substance and Sexual Activity  . Alcohol use: No  . Drug use: No  . Sexual activity: Yes  Lifestyle  . Physical activity:    Days per week: Not on file    Minutes per session: Not on file  . Stress: Not on file  Relationships  . Social connections:    Talks on phone: Not on file    Gets together: Not on file    Attends religious service: Not on file    Active member of club or organization: Not on file    Attends meetings of clubs or organizations: Not on file    Relationship status: Not on file  Other Topics Concern  . Not on file  Social History Narrative   ** Merged History Encounter **       ** Data from: 02/08/11 Enc Dept: [facility]       ** Data from: 09/28/11 Enc Dept: [facility]   Married, lives with spouse and 1 dtr, working at [employer]    No Known Allergies Family History  Problem Relation Age of Onset  . Mental illness Sister        anxiety  . Seizures Brother   . Cancer Paternal Grandmother        ovarian  . Miscarriages / [place] Mother   . Cancer Paternal Aunt        multiple types  . Breast cancer Other          Current Outpatient Medications (Other):  .  sertraline (ZOLOFT) 100 MG tablet, Take 1 tablet (100 mg total) by mouth daily.    Past medical history, social, surgical and family history all reviewed in electronic medical record.  No pertanent information unless stated regarding to the chief complaint.   Review of Systems:  No headache, visual changes, nausea, vomiting, diarrhea, constipation, dizziness, abdominal pain, skin rash, fevers, chills, night sweats, weight loss, swollen lymph nodes, body aches, joint swelling, muscle aches, chest pain, shortness of breath, mood changes.   Objective  There were no vitals taken for this visit. Systems examined below as of    General: No apparent distress alert and oriented x3  mood and affect normal, dressed appropriately.  HEENT: Pupils equal, extraocular movements intact  Respiratory: Patient's speak in full sentences and does not appear short of breath  Cardiovascular: No lower extremity edema, non tender, no erythema  Skin: Warm dry intact with no signs of infection or rash on extremities or on axial skeleton.  Abdomen: Soft nontender  Neuro: Cranial nerves II through XII are intact, neurovascularly intact in all extremities with 2+ DTRs and 2+ pulses.  Lymph: No lymphadenopathy of posterior or anterior cervical chain or axillae bilaterally.  Gait normal with good balance and coordination.  MSK:  Non tender with full range of motion and good stability and symmetric strength and tone of shoulders, elbows, wrist, hip, knee and ankles bilaterally.     Impression and Recommendations:     This case required medical decision making of moderate  complexity. The above documentation has been reviewed and is accurate and complete Lyndal Pulley, DO       Note: This dictation was prepared with Dragon dictation along with smaller phrase technology. Any transcriptional errors that result from this process are unintentional.

## 2018-07-31 DIAGNOSIS — Z3201 Encounter for pregnancy test, result positive: Secondary | ICD-10-CM | POA: Diagnosis not present

## 2018-07-31 DIAGNOSIS — Z3A01 Less than 8 weeks gestation of pregnancy: Secondary | ICD-10-CM | POA: Diagnosis not present

## 2018-07-31 DIAGNOSIS — O2621 Pregnancy care for patient with recurrent pregnancy loss, first trimester: Secondary | ICD-10-CM | POA: Diagnosis not present

## 2018-08-13 ENCOUNTER — Other Ambulatory Visit: Payer: Self-pay | Admitting: Obstetrics and Gynecology

## 2018-08-13 DIAGNOSIS — Z3201 Encounter for pregnancy test, result positive: Secondary | ICD-10-CM | POA: Diagnosis not present

## 2018-09-06 DIAGNOSIS — Z3689 Encounter for other specified antenatal screening: Secondary | ICD-10-CM | POA: Diagnosis not present

## 2018-09-06 DIAGNOSIS — Z87728 Personal history of other specified (corrected) congenital malformations of nervous system and sense organs: Secondary | ICD-10-CM | POA: Diagnosis not present

## 2018-09-06 DIAGNOSIS — O09891 Supervision of other high risk pregnancies, first trimester: Secondary | ICD-10-CM | POA: Diagnosis not present

## 2018-09-06 LAB — OB RESULTS CONSOLE ANTIBODY SCREEN: Antibody Screen: NEGATIVE

## 2018-09-06 LAB — OB RESULTS CONSOLE ABO/RH: RH Type: POSITIVE

## 2018-09-06 LAB — OB RESULTS CONSOLE RUBELLA ANTIBODY, IGM: Rubella: IMMUNE

## 2018-09-06 LAB — OB RESULTS CONSOLE GC/CHLAMYDIA
Chlamydia: NEGATIVE
Gonorrhea: NEGATIVE

## 2018-09-06 LAB — OB RESULTS CONSOLE RPR: RPR: NONREACTIVE

## 2018-09-06 LAB — OB RESULTS CONSOLE HEPATITIS B SURFACE ANTIGEN: Hepatitis B Surface Ag: NEGATIVE

## 2018-09-06 LAB — OB RESULTS CONSOLE HIV ANTIBODY (ROUTINE TESTING): HIV: NONREACTIVE

## 2018-09-16 DIAGNOSIS — O2620 Pregnancy care for patient with recurrent pregnancy loss, unspecified trimester: Secondary | ICD-10-CM | POA: Diagnosis not present

## 2018-09-16 DIAGNOSIS — Z3A11 11 weeks gestation of pregnancy: Secondary | ICD-10-CM | POA: Diagnosis not present

## 2018-09-16 DIAGNOSIS — Z3682 Encounter for antenatal screening for nuchal translucency: Secondary | ICD-10-CM | POA: Diagnosis not present

## 2018-10-18 DIAGNOSIS — Z87728 Personal history of other specified (corrected) congenital malformations of nervous system and sense organs: Secondary | ICD-10-CM | POA: Diagnosis not present

## 2018-10-18 DIAGNOSIS — O2620 Pregnancy care for patient with recurrent pregnancy loss, unspecified trimester: Secondary | ICD-10-CM | POA: Diagnosis not present

## 2018-10-18 DIAGNOSIS — Z3A16 16 weeks gestation of pregnancy: Secondary | ICD-10-CM | POA: Diagnosis not present

## 2018-10-18 DIAGNOSIS — O2622 Pregnancy care for patient with recurrent pregnancy loss, second trimester: Secondary | ICD-10-CM | POA: Diagnosis not present

## 2018-11-06 DIAGNOSIS — O2622 Pregnancy care for patient with recurrent pregnancy loss, second trimester: Secondary | ICD-10-CM | POA: Diagnosis not present

## 2018-11-06 DIAGNOSIS — Z363 Encounter for antenatal screening for malformations: Secondary | ICD-10-CM | POA: Diagnosis not present

## 2018-11-06 DIAGNOSIS — Z3A18 18 weeks gestation of pregnancy: Secondary | ICD-10-CM | POA: Diagnosis not present

## 2018-12-17 DIAGNOSIS — O2622 Pregnancy care for patient with recurrent pregnancy loss, second trimester: Secondary | ICD-10-CM | POA: Diagnosis not present

## 2018-12-17 DIAGNOSIS — Z23 Encounter for immunization: Secondary | ICD-10-CM | POA: Diagnosis not present

## 2018-12-17 DIAGNOSIS — Z3A24 24 weeks gestation of pregnancy: Secondary | ICD-10-CM | POA: Diagnosis not present

## 2019-01-10 DIAGNOSIS — M9905 Segmental and somatic dysfunction of pelvic region: Secondary | ICD-10-CM | POA: Diagnosis not present

## 2019-01-10 DIAGNOSIS — M5417 Radiculopathy, lumbosacral region: Secondary | ICD-10-CM | POA: Diagnosis not present

## 2019-01-10 DIAGNOSIS — M9902 Segmental and somatic dysfunction of thoracic region: Secondary | ICD-10-CM | POA: Diagnosis not present

## 2019-01-10 DIAGNOSIS — M9903 Segmental and somatic dysfunction of lumbar region: Secondary | ICD-10-CM | POA: Diagnosis not present

## 2019-01-13 ENCOUNTER — Telehealth: Payer: Self-pay

## 2019-01-13 DIAGNOSIS — Z20828 Contact with and (suspected) exposure to other viral communicable diseases: Secondary | ICD-10-CM

## 2019-01-13 DIAGNOSIS — Z20822 Contact with and (suspected) exposure to covid-19: Secondary | ICD-10-CM

## 2019-01-13 NOTE — Telephone Encounter (Signed)
Patient's children running a fever over the weekend. She went to urgent care and waited for 3 hours. She is having cough and upset stomach. Her daughter is running a 101 fever. Could you place a covid test for the patient to be tested at [facility]? Patient is [redacted] weeks pregnant.

## 2019-01-13 NOTE — Telephone Encounter (Signed)
Of course!  Done now.  Told her mother Shirlean Mylar who will alert pt it is ordered

## 2019-01-14 DIAGNOSIS — Z3689 Encounter for other specified antenatal screening: Secondary | ICD-10-CM | POA: Diagnosis not present

## 2019-01-14 DIAGNOSIS — O2623 Pregnancy care for patient with recurrent pregnancy loss, third trimester: Secondary | ICD-10-CM | POA: Diagnosis not present

## 2019-01-14 DIAGNOSIS — Z3A28 28 weeks gestation of pregnancy: Secondary | ICD-10-CM | POA: Diagnosis not present

## 2019-01-28 DIAGNOSIS — O2623 Pregnancy care for patient with recurrent pregnancy loss, third trimester: Secondary | ICD-10-CM | POA: Diagnosis not present

## 2019-01-28 DIAGNOSIS — Z23 Encounter for immunization: Secondary | ICD-10-CM | POA: Diagnosis not present

## 2019-01-28 DIAGNOSIS — Z3A30 30 weeks gestation of pregnancy: Secondary | ICD-10-CM | POA: Diagnosis not present

## 2019-02-11 DIAGNOSIS — R102 Pelvic and perineal pain: Secondary | ICD-10-CM | POA: Diagnosis not present

## 2019-02-11 DIAGNOSIS — Z3A32 32 weeks gestation of pregnancy: Secondary | ICD-10-CM | POA: Diagnosis not present

## 2019-02-11 DIAGNOSIS — O2623 Pregnancy care for patient with recurrent pregnancy loss, third trimester: Secondary | ICD-10-CM | POA: Diagnosis not present

## 2019-02-11 DIAGNOSIS — O26893 Other specified pregnancy related conditions, third trimester: Secondary | ICD-10-CM | POA: Diagnosis not present

## 2019-02-18 DIAGNOSIS — M9903 Segmental and somatic dysfunction of lumbar region: Secondary | ICD-10-CM | POA: Diagnosis not present

## 2019-02-18 DIAGNOSIS — M5417 Radiculopathy, lumbosacral region: Secondary | ICD-10-CM | POA: Diagnosis not present

## 2019-02-18 DIAGNOSIS — M9905 Segmental and somatic dysfunction of pelvic region: Secondary | ICD-10-CM | POA: Diagnosis not present

## 2019-02-18 DIAGNOSIS — M9902 Segmental and somatic dysfunction of thoracic region: Secondary | ICD-10-CM | POA: Diagnosis not present

## 2019-02-20 DIAGNOSIS — M9905 Segmental and somatic dysfunction of pelvic region: Secondary | ICD-10-CM | POA: Diagnosis not present

## 2019-02-20 DIAGNOSIS — M5417 Radiculopathy, lumbosacral region: Secondary | ICD-10-CM | POA: Diagnosis not present

## 2019-02-20 DIAGNOSIS — M9902 Segmental and somatic dysfunction of thoracic region: Secondary | ICD-10-CM | POA: Diagnosis not present

## 2019-02-20 DIAGNOSIS — M9903 Segmental and somatic dysfunction of lumbar region: Secondary | ICD-10-CM | POA: Diagnosis not present

## 2019-02-21 NOTE — L&D Delivery Note (Signed)
Delivery Note:   [id number] at [redacted]w[redacted]d  Admitting diagnosis: Encounter for planned induction of labor [Z34.90] Risks: anxiety, hx IUFD x 2, preterm contractions Onset of labor: 1700 Augmentation: AROM, Cytotec and membrane sweep ROM: 1737, clear AF  Complete dilation at 03/27/2019  03/27/2019 Onset of pushing at 1915 FHR second stage category 1  Analgesia /Anesthesia intrapartum:None  Pushing in kneeling and L lateral position with CNM and L&D staff support, FOB Audelia Hives and Janett Billow doula present for birth and supportive.  Delivery of a Live born female  Birth Weight:  pending APGAR: 6, 9  Newborn Delivery   Birth date/time: 03/27/2019 19:21:00 Delivery type: Vaginal, Spontaneous      in cephalic presentation, position OA to LOT.   Nuchal Cord: x2, body somersaulted on perineum, cord unwound after Cord double clamped after cessation of pulsation, cut by FOB.  Collection of cord blood for typing completed. Cord blood donation-None  Arterial cord blood sample-No    Placenta delivered-Spontaneous  with 3 vessels . Uterotonics: Pitocin Placenta to L&D for disposal. Uterine tone firm w/massage and Pitocin, bleeding brisk initially, then light after massage  2nd degree  laceration identified.  Episiotomy:None  Local analgesia: 1% lido  Repair: 3.0 vicryl in standard fashion, good hemostasis Est. Blood Loss ([id number] Complications: None  APGAR:1 min-6 , 5 min-9  10 min-   Mom to postpartum.  Baby Volney Presser to Couplet care / Skin to Skin.  Delivery Report:  Review the Delivery Report for details.     Signed: Juliene Pina, CNM, MSN 03/27/2019, 8:13 PM

## 2019-02-26 DIAGNOSIS — O2623 Pregnancy care for patient with recurrent pregnancy loss, third trimester: Secondary | ICD-10-CM | POA: Diagnosis not present

## 2019-02-26 DIAGNOSIS — Z3A34 34 weeks gestation of pregnancy: Secondary | ICD-10-CM | POA: Diagnosis not present

## 2019-03-07 ENCOUNTER — Encounter (HOSPITAL_COMMUNITY): Payer: Self-pay | Admitting: Obstetrics and Gynecology

## 2019-03-07 ENCOUNTER — Other Ambulatory Visit: Payer: Self-pay

## 2019-03-07 ENCOUNTER — Inpatient Hospital Stay (HOSPITAL_COMMUNITY)
Admission: EM | Admit: 2019-03-07 | Discharge: 2019-03-07 | Disposition: A | Discharge status: Home / Self Care | Payer: BC Managed Care – PPO | Attending: Obstetrics and Gynecology | Admitting: Obstetrics and Gynecology

## 2019-03-07 DIAGNOSIS — O2623 Pregnancy care for patient with recurrent pregnancy loss, third trimester: Secondary | ICD-10-CM | POA: Diagnosis not present

## 2019-03-07 DIAGNOSIS — O99343 Other mental disorders complicating pregnancy, third trimester: Secondary | ICD-10-CM | POA: Insufficient documentation

## 2019-03-07 DIAGNOSIS — Z3403 Encounter for supervision of normal first pregnancy, third trimester: Secondary | ICD-10-CM

## 2019-03-07 DIAGNOSIS — Z3A36 36 weeks gestation of pregnancy: Secondary | ICD-10-CM | POA: Insufficient documentation

## 2019-03-07 DIAGNOSIS — O09293 Supervision of pregnancy with other poor reproductive or obstetric history, third trimester: Secondary | ICD-10-CM | POA: Insufficient documentation

## 2019-03-07 DIAGNOSIS — F329 Major depressive disorder, single episode, unspecified: Secondary | ICD-10-CM | POA: Insufficient documentation

## 2019-03-07 DIAGNOSIS — Z3689 Encounter for other specified antenatal screening: Secondary | ICD-10-CM | POA: Diagnosis not present

## 2019-03-07 DIAGNOSIS — Z79899 Other long term (current) drug therapy: Secondary | ICD-10-CM | POA: Insufficient documentation

## 2019-03-07 DIAGNOSIS — O368330 Maternal care for abnormalities of the fetal heart rate or rhythm, third trimester, not applicable or unspecified: Secondary | ICD-10-CM | POA: Diagnosis not present

## 2019-03-07 DIAGNOSIS — F419 Anxiety disorder, unspecified: Secondary | ICD-10-CM | POA: Diagnosis not present

## 2019-03-07 DIAGNOSIS — Z3685 Encounter for antenatal screening for Streptococcus B: Secondary | ICD-10-CM | POA: Diagnosis not present

## 2019-03-07 HISTORY — DX: Depression, unspecified: F32.A

## 2019-03-07 HISTORY — DX: Anxiety disorder, unspecified: F41.9

## 2019-03-07 HISTORY — DX: Unspecified infectious disease: B99.9

## 2019-03-07 LAB — OB RESULTS CONSOLE GBS: GBS: NEGATIVE

## 2019-03-07 NOTE — MAU Note (Signed)
Sent from office, ? decel when listening to [place] with doppler. Hx of FD x2.

## 2019-03-07 NOTE — MAU Provider Note (Signed)
  History     CSN: [id number]  Arrival date and time: 03/07/19 1607   First Provider Initiated Contact with Patient 03/07/19 1620      Chief Complaint  Patient presents with  . decels in [place]   HPI  Janet Martinez is a 34 y.o. [id number] at [redacted]w[redacted]d presenting from office for extended fetal monitoring d/t audible decels on Doppler during prenatal visit.  Reports no VB/LOF/pain/cramping, notes (+)FM today.   Pregnancy complicated by poor obstetric hx - IUFD 21 and 22 wks cystic hygroma and Turner's Hx anxiety on Zoloft and increased family stress since Christmas, 2 foster children she had cared for last 2 years were removed from home d/t increasing behavioral concerns of older brother.  Past Medical History:  Diagnosis Date  . Anxiety   . Depression   . History of chicken pox   . Infection    UTI  . Medical history non-contributory     Past Surgical History:  Procedure Laterality Date  . NO PAST SURGERIES    . WISDOM TOOTH EXTRACTION      Family History  Problem Relation Age of Onset  . Mental illness Sister        anxiety  . Seizures Brother   . Cancer Paternal Grandmother        ovarian  . Miscarriages / [place] Mother   . Cancer Paternal Aunt        multiple types  . Breast cancer Other     Social History   Tobacco Use  . Smoking status: Never Smoker  . Smokeless tobacco: Never Used  Substance Use Topics  . Alcohol use: Not Currently  . Drug use: No    Allergies: No Known Allergies  Meds - Zoloft, BuSpar, PNV  Review of Systems Physical Exam   Blood pressure 111/72, pulse 78, temperature 98.2 F (36.8 C), temperature source Oral, resp. rate 18, weight 65.5 kg, SpO2 98 %.  Physical Exam Gen AAO x 3, anxious Pulm: CTAB Cardio: RRR Abd: gravid, NT Ext: no edema GU: deferred  EFM FHT 120, mod var, + accels 15x15, no decels Toco - no ctx  MAU Course  Procedures  MDM NST reactive  Assessment and Plan  [id number] at [redacted]w[redacted]d  Extended EFM for  audible decels during OV  FHT category 1 on EFM, Reactive NST  Patient reassured DC home with [facility] precautions, F/U with scheduled OV 1 week.   Juliene Pina, MSN, CNM 03/07/2019, 4:54 PM   Juliene Pina 03/07/2019, 4:39 PM

## 2019-03-10 DIAGNOSIS — O2623 Pregnancy care for patient with recurrent pregnancy loss, third trimester: Secondary | ICD-10-CM | POA: Diagnosis not present

## 2019-03-10 DIAGNOSIS — Z3A36 36 weeks gestation of pregnancy: Secondary | ICD-10-CM | POA: Diagnosis not present

## 2019-03-13 ENCOUNTER — Telehealth (HOSPITAL_COMMUNITY): Payer: Self-pay | Admitting: *Deleted

## 2019-03-13 ENCOUNTER — Encounter (HOSPITAL_COMMUNITY): Payer: Self-pay | Admitting: *Deleted

## 2019-03-13 NOTE — Telephone Encounter (Signed)
Preadmission screen  

## 2019-03-17 DIAGNOSIS — O2620 Pregnancy care for patient with recurrent pregnancy loss, unspecified trimester: Secondary | ICD-10-CM | POA: Diagnosis not present

## 2019-03-17 DIAGNOSIS — O09893 Supervision of other high risk pregnancies, third trimester: Secondary | ICD-10-CM | POA: Diagnosis not present

## 2019-03-17 DIAGNOSIS — Z3A37 37 weeks gestation of pregnancy: Secondary | ICD-10-CM | POA: Diagnosis not present

## 2019-03-17 DIAGNOSIS — O2613 Low weight gain in pregnancy, third trimester: Secondary | ICD-10-CM | POA: Diagnosis not present

## 2019-03-20 DIAGNOSIS — Z3A38 38 weeks gestation of pregnancy: Secondary | ICD-10-CM | POA: Diagnosis not present

## 2019-03-20 DIAGNOSIS — O2613 Low weight gain in pregnancy, third trimester: Secondary | ICD-10-CM | POA: Diagnosis not present

## 2019-03-20 DIAGNOSIS — O09893 Supervision of other high risk pregnancies, third trimester: Secondary | ICD-10-CM | POA: Diagnosis not present

## 2019-03-20 DIAGNOSIS — O2620 Pregnancy care for patient with recurrent pregnancy loss, unspecified trimester: Secondary | ICD-10-CM | POA: Diagnosis not present

## 2019-03-25 ENCOUNTER — Other Ambulatory Visit: Payer: Self-pay

## 2019-03-25 ENCOUNTER — Other Ambulatory Visit (HOSPITAL_COMMUNITY)
Admission: RE | Admit: 2019-03-25 | Discharge: 2019-03-25 | Disposition: A | Discharge status: Home / Self Care | Payer: BC Managed Care – PPO | Source: Ambulatory Visit | Attending: Obstetrics and Gynecology | Admitting: Obstetrics and Gynecology

## 2019-03-25 DIAGNOSIS — Z20822 Contact with and (suspected) exposure to covid-19: Secondary | ICD-10-CM | POA: Insufficient documentation

## 2019-03-25 DIAGNOSIS — Z01812 Encounter for preprocedural laboratory examination: Secondary | ICD-10-CM | POA: Insufficient documentation

## 2019-03-25 LAB — SARS CORONAVIRUS 2 (TAT 6-24 HRS): SARS Coronavirus 2: NEGATIVE

## 2019-03-26 DIAGNOSIS — Z3A38 38 weeks gestation of pregnancy: Secondary | ICD-10-CM | POA: Diagnosis not present

## 2019-03-26 DIAGNOSIS — O2623 Pregnancy care for patient with recurrent pregnancy loss, third trimester: Secondary | ICD-10-CM | POA: Diagnosis not present

## 2019-03-27 ENCOUNTER — Encounter (HOSPITAL_COMMUNITY): Payer: Self-pay

## 2019-03-27 ENCOUNTER — Inpatient Hospital Stay (HOSPITAL_COMMUNITY)
Admission: AD | Admit: 2019-03-27 | Discharge: 2019-03-29 | DRG: 806 | Disposition: A | Discharge status: Home / Self Care | Payer: BC Managed Care – PPO

## 2019-03-27 ENCOUNTER — Inpatient Hospital Stay (HOSPITAL_COMMUNITY): Payer: BC Managed Care – PPO

## 2019-03-27 DIAGNOSIS — O99344 Other mental disorders complicating childbirth: Secondary | ICD-10-CM | POA: Diagnosis present

## 2019-03-27 DIAGNOSIS — F419 Anxiety disorder, unspecified: Secondary | ICD-10-CM | POA: Diagnosis not present

## 2019-03-27 DIAGNOSIS — Z3A39 39 weeks gestation of pregnancy: Secondary | ICD-10-CM | POA: Diagnosis not present

## 2019-03-27 DIAGNOSIS — F329 Major depressive disorder, single episode, unspecified: Secondary | ICD-10-CM | POA: Diagnosis not present

## 2019-03-27 DIAGNOSIS — O26893 Other specified pregnancy related conditions, third trimester: Secondary | ICD-10-CM | POA: Diagnosis not present

## 2019-03-27 DIAGNOSIS — O2243 Hemorrhoids in pregnancy, third trimester: Secondary | ICD-10-CM | POA: Diagnosis not present

## 2019-03-27 DIAGNOSIS — Z3490 Encounter for supervision of normal pregnancy, unspecified, unspecified trimester: Secondary | ICD-10-CM | POA: Diagnosis present

## 2019-03-27 LAB — CBC
HCT: 43.3 % (ref 36.0–46.0)
Hemoglobin: 14.5 g/dL (ref 12.0–15.0)
MCH: 32.4 pg (ref 26.0–34.0)
MCHC: 33.5 g/dL (ref 30.0–36.0)
MCV: 96.7 fL (ref 80.0–100.0)
Platelets: 180 10*3/uL (ref 150–400)
RBC: 4.48 MIL/uL (ref 3.87–5.11)
RDW: 12.4 % (ref 11.5–15.5)
WBC: 10.9 10*3/uL — ABNORMAL HIGH (ref 4.0–10.5)
nRBC: 0 % (ref 0.0–0.2)

## 2019-03-27 LAB — TYPE AND SCREEN
ABO/RH(D): O POS
Antibody Screen: NEGATIVE

## 2019-03-27 LAB — RPR: RPR Ser Ql: NONREACTIVE

## 2019-03-27 LAB — ABO/RH: ABO/RH(D): O POS

## 2019-03-27 MED ORDER — OXYTOCIN 40 UNITS IN NORMAL SALINE INFUSION - SIMPLE MED
2.5000 [IU]/h | INTRAVENOUS | Status: DC
  Filled 2019-03-27: qty 1000

## 2019-03-27 MED ORDER — METHYLERGONOVINE MALEATE 0.2 MG/ML IJ SOLN: 0.2000 mg | INTRAMUSCULAR | Status: DC | PRN

## 2019-03-27 MED ORDER — SODIUM CHLORIDE 0.9% FLUSH: 3.0000 mL | Freq: Two times a day (BID) | INTRAVENOUS | Status: DC

## 2019-03-27 MED ORDER — FLEET ENEMA 7-19 GM/118ML RE ENEM: 1.0000 | ENEMA | Freq: Every day | RECTAL | Status: DC | PRN

## 2019-03-27 MED ORDER — ONDANSETRON HCL 4 MG/2ML IJ SOLN: 4.0000 mg | INTRAMUSCULAR | Status: DC | PRN

## 2019-03-27 MED ORDER — METHYLERGONOVINE MALEATE 0.2 MG PO TABS
0.2000 mg | ORAL_TABLET | ORAL | Status: DC | PRN
  Administered 2019-03-28: 0.2 mg via ORAL
  Filled 2019-03-27: qty 1

## 2019-03-27 MED ORDER — SENNOSIDES-DOCUSATE SODIUM 8.6-50 MG PO TABS
2.0000 | ORAL_TABLET | ORAL | Status: DC
  Administered 2019-03-28 (×2): 2 via ORAL
  Filled 2019-03-27 (×2): qty 2

## 2019-03-27 MED ORDER — SODIUM CHLORIDE 0.9 % IV SOLN: 250.0000 mL | INTRAVENOUS | Status: DC | PRN

## 2019-03-27 MED ORDER — WITCH HAZEL-GLYCERIN EX PADS: 1.0000 "application " | MEDICATED_PAD | CUTANEOUS | Status: DC | PRN

## 2019-03-27 MED ORDER — ACETAMINOPHEN 325 MG PO TABS: 650.0000 mg | ORAL_TABLET | ORAL | Status: DC | PRN

## 2019-03-27 MED ORDER — IBUPROFEN 600 MG PO TABS
600.0000 mg | ORAL_TABLET | Freq: Four times a day (QID) | ORAL | Status: DC
  Administered 2019-03-28 – 2019-03-29 (×6): 600 mg via ORAL
  Filled 2019-03-27 (×7): qty 1

## 2019-03-27 MED ORDER — FAMOTIDINE 20 MG PO TABS
20.0000 mg | ORAL_TABLET | Freq: Two times a day (BID) | ORAL | Status: DC
  Administered 2019-03-27 – 2019-03-29 (×6): 20 mg via ORAL
  Filled 2019-03-27 (×6): qty 1

## 2019-03-27 MED ORDER — DIPHENHYDRAMINE HCL 25 MG PO CAPS: 25.0000 mg | ORAL_CAPSULE | Freq: Four times a day (QID) | ORAL | Status: DC | PRN

## 2019-03-27 MED ORDER — MISOPROSTOL 50MCG HALF TABLET
50.0000 ug | ORAL_TABLET | ORAL | Status: DC | PRN
  Administered 2019-03-27 (×2): 50 ug via ORAL
  Filled 2019-03-27 (×2): qty 1

## 2019-03-27 MED ORDER — SOD CITRATE-CITRIC ACID 500-334 MG/5ML PO SOLN
30.0000 mL | ORAL | Status: DC | PRN
  Administered 2019-03-27: 15:00:00 30 mL via ORAL
  Filled 2019-03-27: qty 30

## 2019-03-27 MED ORDER — SERTRALINE HCL 100 MG PO TABS
100.0000 mg | ORAL_TABLET | Freq: Every day | ORAL | Status: DC
  Administered 2019-03-27 – 2019-03-28 (×2): 100 mg via ORAL
  Filled 2019-03-27 (×2): qty 1

## 2019-03-27 MED ORDER — ACETAMINOPHEN 325 MG PO TABS
650.0000 mg | ORAL_TABLET | ORAL | Status: DC | PRN
  Administered 2019-03-27 – 2019-03-28 (×3): 650 mg via ORAL
  Filled 2019-03-27 (×3): qty 2

## 2019-03-27 MED ORDER — SODIUM CHLORIDE 0.9% FLUSH: 3.0000 mL | INTRAVENOUS | Status: DC | PRN

## 2019-03-27 MED ORDER — TERBUTALINE SULFATE 1 MG/ML IJ SOLN
0.2500 mg | Freq: Once | INTRAMUSCULAR | Status: DC | PRN
  Filled 2019-03-27: qty 1

## 2019-03-27 MED ORDER — ONDANSETRON HCL 4 MG PO TABS
4.0000 mg | ORAL_TABLET | ORAL | Status: DC | PRN
  Administered 2019-03-28: 05:00:00 4 mg via ORAL
  Filled 2019-03-27: qty 1

## 2019-03-27 MED ORDER — DIBUCAINE (PERIANAL) 1 % EX OINT: 1.0000 "application " | TOPICAL_OINTMENT | CUTANEOUS | Status: DC | PRN

## 2019-03-27 MED ORDER — ONDANSETRON 4 MG PO TBDP
4.0000 mg | ORAL_TABLET | Freq: Once | ORAL | Status: AC
  Administered 2019-03-27: 4 mg via ORAL
  Filled 2019-03-27: qty 1

## 2019-03-27 MED ORDER — ONDANSETRON HCL 4 MG/2ML IJ SOLN: 4.0000 mg | Freq: Four times a day (QID) | INTRAMUSCULAR | Status: DC | PRN

## 2019-03-27 MED ORDER — COCONUT OIL OIL
1.0000 "application " | TOPICAL_OIL | Status: DC | PRN
  Administered 2019-03-27: 1 via TOPICAL

## 2019-03-27 MED ORDER — OXYTOCIN BOLUS FROM INFUSION
500.0000 mL | Freq: Once | INTRAVENOUS | Status: AC
  Administered 2019-03-27: 20:00:00 500 mL via INTRAVENOUS

## 2019-03-27 MED ORDER — SIMETHICONE 80 MG PO CHEW: 80.0000 mg | CHEWABLE_TABLET | ORAL | Status: DC | PRN

## 2019-03-27 MED ORDER — PRENATAL MULTIVITAMIN CH
1.0000 | ORAL_TABLET | Freq: Every day | ORAL | Status: DC
  Administered 2019-03-28 – 2019-03-29 (×2): 1 via ORAL
  Filled 2019-03-27 (×2): qty 1

## 2019-03-27 MED ORDER — LIDOCAINE HCL (PF) 1 % IJ SOLN
30.0000 mL | INTRAMUSCULAR | Status: AC | PRN
  Administered 2019-03-27: 30 mL via SUBCUTANEOUS
  Filled 2019-03-27: qty 30

## 2019-03-27 MED ORDER — BISACODYL 10 MG RE SUPP: 10.0000 mg | Freq: Every day | RECTAL | Status: DC | PRN

## 2019-03-27 MED ORDER — CALCIUM CARBONATE ANTACID 500 MG PO CHEW
1.0000 | CHEWABLE_TABLET | ORAL | Status: DC | PRN
  Administered 2019-03-27: 400 mg via ORAL
  Filled 2019-03-27: qty 2

## 2019-03-27 MED ORDER — TETANUS-DIPHTH-ACELL PERTUSSIS 5-2.5-18.5 LF-MCG/0.5 IM SUSP: 0.5000 mL | Freq: Once | INTRAMUSCULAR | Status: DC

## 2019-03-27 MED ORDER — ZOLPIDEM TARTRATE 5 MG PO TABS: 5.0000 mg | ORAL_TABLET | Freq: Every evening | ORAL | Status: DC | PRN

## 2019-03-27 MED ORDER — BUSPIRONE HCL 5 MG PO TABS
5.0000 mg | ORAL_TABLET | Freq: Every day | ORAL | Status: DC | PRN
  Filled 2019-03-27: qty 1

## 2019-03-27 MED ORDER — BENZOCAINE-MENTHOL 20-0.5 % EX AERO
1.0000 "application " | INHALATION_SPRAY | CUTANEOUS | Status: DC | PRN
  Administered 2019-03-27: 1 via TOPICAL
  Filled 2019-03-27: qty 56

## 2019-03-27 NOTE — Progress Notes (Signed)
Patient ID: Janet Martinez, female   DOB: Jun 27, 1985, 34 y.o.   MRN: [id number] Janet Martinez is a 34 y.o. [id number] at [redacted]w[redacted]d by ultrasound admitted for elective IOL at term.  Subjective: Feels ctx more intense for past hour, having to breath through them. Working w/ doula to relax, spouse present and supportive.   Objective: Vitals:   03/27/19 0848 03/27/19 0957 03/27/19 1357  BP: (!) 99/54 103/71 106/72  Pulse: 74 75 73  Resp: 18 18 18   Temp: 97.7 F (36.5 C)  97.7 F (36.5 C)  TempSrc: Oral  Oral  Weight: 64.8 kg    Height: 5\' 2"  (1.575 m)      FHT:  FHR: 120 bpm, variability: moderate,  accelerations:  Present,  decelerations:  Absent UC:   regular, every 2-3 minutes SVE:   Dilation: 4 Effacement (%): 70 Station: -1 Exam by:: Janet Martinez, CNM AROM, clear AF Labs:   Recent Labs    03/27/19 0937  WBC 10.9*  HGB 14.5  HCT 43.3  PLT 180    Assessment / Plan: IOL at term  Cervical ripening Cytotec 50 mcg x 2 buccal and membrane sweep  Labor: Progressing normally, AROM augmentation, anticipate active phase of labor Preeclampsia:  no signs or symptoms of toxicity Fetal Wellbeing:  Category I Pain Control:  Labor support without medications I/D:  n/a Anticipated MOD:  NSVD  Janet Martinez, CNM, MSN 03/27/2019, 5:45 PM

## 2019-03-27 NOTE — Plan of Care (Signed)
L&d care plan complete

## 2019-03-27 NOTE — H&P (Signed)
OB ADMISSION/ HISTORY & PHYSICAL:  Admission Date: 03/27/2019  8:18 AM  Admit Diagnosis: Encounter for planned induction of labor [Z34.90]    Janet Martinez is a 34 y.o. female presenting for scheduled elective IOL. Seen in office yesterday and had membrane sweep. Desires low intervention birth, prefers no epidural this time. Has doula Janett Billow for support and spouse Audelia Hives at [facility] and supportive.  Prenatal History: [id number]   EDC : 04/03/2019 c/w 1T sono Prenatal care at [facility] since 11 wks, CNM care   Prenatal course complicated by: Poor OB hx, IUFD x 2 for cystic hygroma/Turner's at 48 and 21 wks Anxiety/depression controlled on Zoloft and BuSpar Preterm ctx at 32 wks Social stressors during pregnancy exhacerbating anxiety - foster children placed with new families during pregnancy d/t behavioral issues of oldest one, patient with significant grief over the loss of children.   Prenatal Labs: ABO, Rh: O (07/17 0000)  Antibody: PENDING (02/04 0937)neg Rubella: Immune (07/17 0000)  RPR: Nonreactive (07/17 0000)  HBsAg: Negative (07/17 0000)  HIV: Non-reactive (07/17 0000)  GBS: Negative/-- (01/15 0000)  1 hr Glucola : wnl Genetic Screening: normal NIPS, AFP1, and NT sono Ultrasound: normal XY anatomy, posterior placenta    Maternal Diabetes: No Genetic Screening: Normal Maternal Ultrasounds/Referrals: Normal Fetal Ultrasounds or other Referrals:  None Maternal Substance Abuse:  No Significant Maternal Medications:  Meds include: Zoloft Other:  Significant Maternal Lab Results:  Group B Strep negative Other Comments:  None  Medical / Surgical History :  Past medical history:  Past Medical History:  Diagnosis Date  . Anxiety   . Depression   . History of chicken pox   . Infection    UTI  . Medical history non-contributory      Past surgical history:  Past Surgical History:  Procedure Laterality Date  . NO PAST SURGERIES    . WISDOM TOOTH  EXTRACTION       Family History:  Family History  Problem Relation Age of Onset  . Mental illness Sister        anxiety  . Seizures Brother   . Cancer Paternal Grandmother        ovarian  . Miscarriages / [place] Mother   . Cancer Paternal Aunt        multiple types  . Breast cancer Other      Social History:  reports that she has never smoked. She has never used smokeless tobacco. She reports previous alcohol use. She reports that she does not use drugs.   Allergies: Patient has no known allergies.   Current Medications at time of admission:  Medications Prior to Admission  Medication Sig Dispense Refill Last Dose  . sertraline (ZOLOFT) 100 MG tablet Take 1 tablet (100 mg total) by mouth daily. 90 tablet 3      Review of Systems: ROS Feeling mild ctx, comfortable, no LOF/VB Denies HA/NV/RUQ pain  Physical Exam: Vital signs and nursing notes reviewed.  ED Triage Vitals [03/27/19 0848]  Enc Vitals Group     BP (!) 99/54     Pulse Rate 74     Resp 18     Temp 97.7 F (36.5 C)     Temp Source Oral     SpO2      Weight 142 lb 12.8 oz (64.8 kg)     Height 5\' 2"  (1.575 m)     Head Circumference      Peak Flow      Pain  Score      Pain Loc      Pain Edu?      Excl. in [place]?      General: AAO x 3, NAD, coping well, anxiety stable Heart: RRR Lungs:CTAB Abdomen: Gravid, NT, Leopold's vertex, fetal spine to maternal L Extremities: no edema Genitalia / VE: Dilation: 3 Effacement (%): 60 Cervical Position: Posterior Station: -1 Presentation: Vertex Exam by:: D Australia Droll CNM  Membrane sweep repeated  FHR:  120 BPM, mod variability, + accels, no decels TOCO: Ctx q 3-5 min, mild  Labs:   Pending T&S, RPR  Recent Labs    03/27/19 0937  WBC 10.9*  HGB 14.5  HCT 43.3  PLT 180       Assessment:  34 y.o. [id number] at [redacted]w[redacted]d  1. Induction of labor - s/p Cytotec 50 mcg x 1, membrane sweep x 2 2. FHR category 1 3. GBS neg 4. Desires unmedicated birth,  using doula support 5. Breastfeeding 6. Placenta disposal per patient request 7. Anxiety stable on Zoloft and BuSpar  Plan:  1. Admit to BS 2. Routine L&D orders 3. Analgesia/anesthesia PRN  4. Cervical ripening ongoing, then AROM if favorable, prefers no Pitocin if possible 5. Anticipate NSVB 6. Monitor for PPD postpartum  Dr Ronita Hipps notified of admission / plan of care   [place], MSN 03/27/2019, 10:57 AM

## 2019-03-28 LAB — CBC
HCT: 34.9 % — ABNORMAL LOW (ref 36.0–46.0)
Hemoglobin: 11.7 g/dL — ABNORMAL LOW (ref 12.0–15.0)
MCH: 32 pg (ref 26.0–34.0)
MCHC: 33.5 g/dL (ref 30.0–36.0)
MCV: 95.4 fL (ref 80.0–100.0)
Platelets: 137 10*3/uL — ABNORMAL LOW (ref 150–400)
RBC: 3.66 MIL/uL — ABNORMAL LOW (ref 3.87–5.11)
RDW: 12.2 % (ref 11.5–15.5)
WBC: 15 10*3/uL — ABNORMAL HIGH (ref 4.0–10.5)
nRBC: 0 % (ref 0.0–0.2)

## 2019-03-28 MED ORDER — METHYLERGONOVINE MALEATE 0.2 MG PO TABS
0.2000 mg | ORAL_TABLET | ORAL | Status: AC | PRN
  Administered 2019-03-28 (×3): 0.2 mg via ORAL
  Filled 2019-03-28 (×3): qty 1

## 2019-03-28 MED ORDER — CALCIUM CARBONATE ANTACID 500 MG PO CHEW
1.0000 | CHEWABLE_TABLET | ORAL | Status: DC | PRN
  Administered 2019-03-28: 400 mg via ORAL
  Filled 2019-03-28: qty 2

## 2019-03-28 MED ORDER — OXYCODONE HCL 5 MG PO TABS
5.0000 mg | ORAL_TABLET | ORAL | Status: DC | PRN
  Administered 2019-03-28: 5 mg via ORAL
  Filled 2019-03-28: qty 1

## 2019-03-28 NOTE — Progress Notes (Signed)
Patient ID: Janet Martinez, female   DOB: 1985/09/24, 34 y.o.   MRN: [id number] Spoke with RN re concern for excessive postpartum bleeding. She gave pt PO Methergine 0.2 mg. VS stable, CBC this AM good. CNM Daniela plans to see patient and update me.  --V.Benjie Karvonen, MD

## 2019-03-28 NOTE — Lactation Note (Signed)
This note was copied from a baby's chart. Lactation Consultation Note  Patient Name: Janet Martinez [id number] Date: 03/28/2019   Infant is 58 hrs old. Per Mom, infant fed well overnight, but has been sleepy much of the day, with very short feedings. Hand expression was taught to Mom & the infant was given EBM on a gloved finger. Infant was noted to have a great suck.  Infant was then ready to latch, but only for a few minutes. I let Mom know that, if needed, she could do what we had done: hand express a small amount into a spoon & offer with clean/gloved finger.  Mom has smaller-diameter nipples. I provided a size 21 flange in case she uses the hand pump that is at her bedside.     Matthias Hughs [facility] 03/28/2019, 3:25 PM

## 2019-03-28 NOTE — Plan of Care (Signed)
  Problem: Life Cycle: Goal: Chance of risk for complications during the postpartum period will decrease Note: Upon assessment this morning, patient's bleeding seemed heavier. Night shift RN reported that bleeding increased around 0430. Triton measured 116 QBL. Fundus firm. Changed pad and gave patient prn dose of po methergine. Called Dr. Benjie Karvonen to notify of increased bleeding. Encouraged patient to call if bleeding seemed heavier. Saw Haskel Khan, CNM face to face in hallway and notified her as well. Daniella to assess. Maxwell Caul, Leretha Dykes [place]

## 2019-03-28 NOTE — Progress Notes (Signed)
Patient ID: Janet Martinez, female   DOB: Mar 05, 1985, 34 y.o.   MRN: [id number] PPD # 1 S/P NSVD  Live born female  Birth Weight: 6 lb 11.2 oz (3039 g) APGAR: 6, 9  Newborn Delivery   Birth date/time: 03/27/2019 19:21:00 Delivery type: Vaginal, Spontaneous     Baby name: Volney Presser Delivering provider: Derrell Lolling C  Episiotomy:None   Lacerations:2nd degree   circumcision planned later today, parents consented  Feeding: breast, clusterfeeding during the night  Pain control at delivery: None   S:  Reports feeling tired but well.             Tolerating po/ No nausea or vomiting             Bleeding was moderate, heavy during night with clots x 2, resolved since methergine dose given PO. Total EBL overnight 300cc.             Pain controlled with ibuprofen (OTC)             Up ad lib / ambulatory / voiding without difficulties   O:  A & O x 3, in no apparent distress              VS:  Vitals:   03/28/19 0313 03/28/19 0412 03/28/19 0427 03/28/19 0715  BP: 101/71 98/62 110/76 (!) 98/56  Pulse: (!) 56 (!) 56 (!) 59 (!) 58  Resp:    16  Temp: (!) 97.5 F (36.4 C)   98.2 F (36.8 C)  TempSrc:    Oral  SpO2:    99%  Weight:      Height:        LABS:  Recent Labs    03/27/19 0937 03/28/19 0501  WBC 10.9* 15.0*  HGB 14.5 11.7*  HCT 43.3 34.9*  PLT 180 137*    Blood type: --/--/O POS, O POS Performed at [facility] [address], [place], [place] [address]  [phone] [id number])  Rubella: Immune (07/17 0000)   I&O: I/O last 3 completed shifts: In: -  Out: 631 [Blood:631]          Total I/O In: -  Out: 116 [Blood:116]  Vaccines: TDaP UTD         Flu    UTD   Lungs: Clear and unlabored  Heart: regular rate and rhythm / no murmurs  Abdomen: soft, non-tender, non-distended             Fundus: firm, non-tender, U-1, no further clots noted  Perineum: repair intact, small hemorrhoid non-thrombosed  Lochia: small  Extremities: no edema, no calf pain or  tenderness    A/P: PPD # 1 34 y.o., [id number]   Principal Problem:   Postpartum care following vaginal delivery 2/4 Active Problems:   SVD (spontaneous vaginal delivery)   Encounter for planned induction of labor   Perineal laceration, second degree Increased lochia with clots overnight, now stable, s/p 1 dose methergine PO  - total EBL 650 cc  - continue methergine PO x 3 more doses scheduled.   Doing well - stable status  Routine post partum orders  Anticipate discharge tomorrow    Juliene Pina, MSN, CNM 03/28/2019, 9:40 AM

## 2019-03-28 NOTE — Progress Notes (Signed)
CSW received consult for history of anxiety and depression. CSW met with MOB to offer support and complete assessment.    MOB resting in bed with FOB present at bedside and holding infant, when CSW entered the room. CSW introduced self and received verbal permission from MOB to complete assessment with FOB present. CSW explained reason for consult to which MOB expressed understanding. CSW inquired about MOB's mental health history and MOB acknowledged a history of anxiety and depression for about 5 years now. MOB stated she is currently on Zoloft and feels that helps manage her symptoms. MOB did acknowledge an increase in her symptoms during her pregnancy due to life stressors but didn't feel like it was anything she couldn't manage. MOB denied any previous history of PPD/A and reported being aware of signs and symptoms. CSW provided brief education regarding the baby blues period vs. perinatal mood disorders. CSW recommended self-evaluation during the postpartum time period using the New Mom Checklist from Postpartum Progress and encouraged MOB to contact a medical professional if symptoms are noted at any time. MOB reported she has a therapist she can reach out to in the event she needs it. MOB did not appear to be displaying any acute mental health symptoms and denied any current SI or HI. MOB reported having good support from FOB, her mother, sister and some friends.  MOB and FOB confirmed having all essential items for infant once discharged and stated infant would be sleeping in a bassinet once home. CSW provided review of Sudden Infant Death Syndrome (SIDS) precautions and safe sleeping habits.    CSW identifies no further need for intervention and no barriers to discharge at this time.  Elijio Miles, LCSW [facility] [employer] [phone]

## 2019-03-28 NOTE — Lactation Note (Signed)
This note was copied from a baby's chart. Lactation Consultation Note Baby 18 hrs old.  Mom stated the baby wants to BF constantly it seems. Mom states she is starting to get sore. Baby BF in cradle position to Rt. Breast. Occasional swallows when breast massaged. Breast soft. Rt. Nipple very short shaft, very compressible. Mom denies painful latching. Mom stated it hurts on the Lt. Breast nipple is longer. Lt. Nipple everted w/short shaft. Mom hand expressed demonstrating colostrum.  Mom has 64 yr old daughter that she BF for 6 months and had to BF/Formula feed d/t wt. Loss. Mom stated it hurt the whole time she BF. Mom c/o severe cramping stating she feels gushes of blood. Explained normal for cramping, get more intense w/each baby.  BF causes uterus to contract causes some gushing. Encouraged mom to call if it feels like a lot.   Discussed positioning and support. Placed baby in football hold to see if more comfortable. Baby feeding well. Mom stating she felt like the bleeding was a lot. LC called for RN to check mom. Mom had a lot of blood on her pad.  LC placed baby in bassinet and swaddled. Baby fussy. [place] asked FOB to hold baby. RN's working w/mom. LC f/u at a later time.    Patient Name: Janet Martinez [id number] Date: 03/28/2019 Reason for consult: Initial assessment;Term   Maternal Data Has patient been taught Hand Expression?: Yes  Feeding Feeding Type: Breast Fed  LATCH Score Latch: Grasps breast easily, tongue down, lips flanged, rhythmical sucking.  Audible Swallowing: A few with stimulation  Type of Nipple: Everted at rest and after stimulation(short shaft)  Comfort (Breast/Nipple): Filling, red/small blisters or bruises, mild/mod discomfort(Lt. nipple tender)  Hold (Positioning): Assistance needed to correctly position infant at breast and maintain latch.  LATCH Score: 7  Interventions Interventions: Breast feeding basics reviewed;Support pillows;Assisted with  latch;Position options;Skin to skin;Breast massage;Hand express;Pre-pump if needed;Shells;Comfort gels;Hand pump;Breast compression;Adjust position  Lactation Tools Discussed/Used Tools: Shells;Pump Shell Type: Inverted Breast pump type: Manual WIC Program: No Pump Review: Setup, frequency, and cleaning;Milk Storage Initiated by:: L> Vasil Juhasz RN IBCLC Date initiated:: 03/28/19   Consult Status Consult Status: Follow-up Date: 03/29/19(in pm) Follow-up type: In-patient    Theodoro Kalata 03/28/2019, 4:23 AM

## 2019-03-28 NOTE — Progress Notes (Signed)
Called to room by [facility] reporting "mom is gushing blood". Per assessment, mom noted to have moderate amount of blood on pad. Fundus firm, no trickle of blood with massage noted initially. Third rub of fundus resulted in the passage of tennis ball sized clot. Mom assisted to bathroom to void, assisted with perineal care and back to bed. QBL per triton 281. No bleeding with follow up rub. Will continue to monitor and advised mom to call out if she felt as though her bleeding is worsening.

## 2019-03-29 NOTE — Discharge Summary (Signed)
OB Discharge Summary     Patient Name: Janet Martinez DOB: Jan 15, 1986 MRN: [id number]  Date of admission: 03/27/2019 Delivering MD: Juliene Pina   Date of discharge: 03/29/2019  Admitting diagnosis: Encounter for planned induction of labor [Z34.90] Intrauterine pregnancy: [redacted]w[redacted]d     Secondary diagnosis:  Principal Problem:   Postpartum care following vaginal delivery 2/4 Active Problems:   SVD (spontaneous vaginal delivery)   Encounter for planned induction of labor   Perineal laceration, second degree  Additional problems:  Anxiety disorder     Discharge diagnosis: Term Pregnancy Delivered                                                                                                Post partum procedures:none  Augmentation: AROM  Complications: None  Hospital course:  Induction of Labor With Vaginal Delivery   34 y.o. yo [id number] at [redacted]w[redacted]d was admitted to the hospital 03/27/2019 for induction of labor.  Indication for induction: Favorable cervix at term.  Patient had an uncomplicated labor course as follows: Membrane Rupture Time/Date: 5:37 PM ,03/27/2019   Intrapartum Procedures: Episiotomy: None [1]                                         Lacerations:  2nd degree [3]  Patient had delivery of a Viable infant.  Information for the patient's newborn:  Niha, Srey [id number]  Delivery Method: Vaginal, Spontaneous(Filed from Delivery Summary)    03/27/2019  Details of delivery can be found in separate delivery note.  Patient had a routine postpartum course. Patient is discharged home 03/30/19.  Physical exam  Vitals:   03/28/19 1130 03/28/19 1724 03/28/19 2223 03/29/19 0607  BP: 98/64 93/72 115/73 (!) 96/57  Pulse: 63 62 (!) 54 (!) 58  Resp: 18 18 16    Temp: 98.2 F (36.8 C) 97.9 F (36.6 C) 98.1 F (36.7 C) 98.1 F (36.7 C)  TempSrc: Oral Oral Oral   SpO2: 99% 99%    Weight:      Height:       General: alert, cooperative and no distress Lochia:  appropriate Uterine Fundus: firm Incision: N/A DVT Evaluation: No evidence of DVT seen on physical exam. Labs: Lab Results  Component Value Date   WBC 15.0 (H) 03/28/2019   HGB 11.7 (L) 03/28/2019   HCT 34.9 (L) 03/28/2019   MCV 95.4 03/28/2019   PLT 137 (L) 03/28/2019   CMP Latest Ref Rng & Units 03/14/2017  Glucose 70 - 99 mg/dL 93  BUN 6 - 23 mg/dL 18  Creatinine 0.40 - 1.20 mg/dL 0.72  Sodium 135 - 145 mEq/L 139  Potassium 3.5 - 5.1 mEq/L 4.8  Chloride 96 - 112 mEq/L 102  CO2 19 - 32 mEq/L 25  Calcium 8.4 - 10.5 mg/dL 9.9  Total Protein 6.0 - 8.3 g/dL 7.7  Total Bilirubin 0.2 - 1.2 mg/dL 0.5  Alkaline Phos 39 - 117 U/L 52  AST 0 - 37 U/L 21  ALT 0 -  35 U/L 21    Discharge instruction: per After Visit Summary and "Baby and Me Booklet".  After visit meds:  Allergies as of 03/29/2019   No Known Allergies     Medication List    TAKE these medications   busPIRone 5 MG tablet Commonly known as: BUSPAR Take 5 mg by mouth daily as needed (For anxiety).   Fish Oil 1000 MG Caps Take 2 capsules by mouth at bedtime.   magnesium oxide 400 MG tablet Commonly known as: MAG-OX Take 400 mg by mouth at bedtime.   prenatal multivitamin Tabs tablet Take 1 tablet by mouth at bedtime.   Probiotic Caps Take 1 capsule by mouth daily.   sertraline 100 MG tablet Commonly known as: ZOLOFT Take 1 tablet (100 mg total) by mouth daily.       Diet: routine diet  Activity: Advance as tolerated. Pelvic rest for 6 weeks.   Outpatient follow up:6 weeks Follow up Appt:No future appointments. Follow up Visit:No follow-ups on file.  Postpartum contraception: Undecided  Newborn Data: Live born female  Birth Weight: 6 lb 11.2 oz (3039 g) APGAR: 6, 9  Newborn Delivery   Birth date/time: 03/27/2019 19:21:00 Delivery type: Vaginal, Spontaneous      Baby Feeding: Breast Disposition:home with mother   03/29/2019 Marvene Staff, MD

## 2019-03-29 NOTE — Lactation Note (Addendum)
This note was copied from a baby's chart. Lactation Consultation Note  Patient Name: Janet Martinez [id number] Date: 03/29/2019   Infant was 58 hrs old at beginning of consult. Infant was actively cueing, but Mom said infant had been feeding since 65. Mom put infant to breast again (both sides) & there were no signs of milk transfer. Parents had felt that infant's fussiness was discomfort from circumcision. Mom's breasts do not palpate any differently than yesterday.   Mom signed consent form for DBM. Supplementing at breast was attempted, but it became messy. In an effort to find an easy replicable way to supplement at home, Dad was taught finger-feeding (parents were not interested in using a bottle at this time).  Infant did very well with finger-feeding with a 5 [place] tube and syringe. Infant was able to draw DBM from syringe & plunger did not need to be pushed. Dad was shown how to wash parts.  Mom has support from her friend & doula for assistance at home. Mom has been given contact information for [facility] support as well.  Plan: 1. Offer breast as desired. 2. Supplement using finger-feeding 3. Pump whenever infant receives supplement that is not Mom's EBM.   Mom plans to use her hand pump before going home. Mom has a Spectra pump at home.  Matthias Hughs [facility] 03/29/2019, 9:17 AM

## 2019-03-29 NOTE — Lactation Note (Signed)
This note was copied from a baby's chart. Lactation Consultation Note  Patient Name: Janet Martinez [id number] Date: 03/29/2019  P2, 54 hour female infant. [place] notice a do not disturb notice on patient door. Kathleen asked RN how breastfeeding was going with mom and infant and did she need any asistance. RN stated breastfeeding was going well and mom is fine.   Maternal Data    Feeding Feeding Type: Breast Fed  LATCH Score                   Interventions    Lactation Tools Discussed/Used     Consult Status      Vicente Serene 03/29/2019, 5:04 AM

## 2019-03-29 NOTE — Progress Notes (Signed)
PPD #2 S: no complaint  O: BP (!) 96/57   Pulse (!) 58   Temp 98.1 F (36.7 C)   Resp 16   Ht 5\' 2"  (1.575 m)   Wt 64.8 kg   SpO2 99%   Breastfeeding Unknown   BMI 26.12 kg/m  Lungs clear to A  Cor RRR   abd Uterus firm 2 FB belwo umb Pad mod lochia    IMP: PPD #2 s/p SVD P0 dc/ home   WOB pp booklet given  f/u 6 wk  d/c instructions reviewed

## 2019-03-29 NOTE — Discharge Instructions (Signed)
Per WOB postpartum booklet given

## 2019-05-17 ENCOUNTER — Other Ambulatory Visit: Payer: Self-pay | Admitting: Family Medicine

## 2019-05-17 DIAGNOSIS — F4323 Adjustment disorder with mixed anxiety and depressed mood: Secondary | ICD-10-CM

## 2019-05-19 NOTE — Telephone Encounter (Signed)
Sertraline refill sent to pharmacy. Pt last OV was 04/18/18 and is past due. Attempted to reach pt by phone. Number listed on record had a hispanic voicemail greeting and disconnected at end of greeting. Mailed letter to pt to call to schedule OV.

## 2019-06-10 ENCOUNTER — Encounter: Payer: Self-pay | Admitting: Family Medicine

## 2019-06-10 ENCOUNTER — Other Ambulatory Visit: Payer: Self-pay

## 2019-06-10 ENCOUNTER — Ambulatory Visit (INDEPENDENT_AMBULATORY_CARE_PROVIDER_SITE_OTHER): Payer: BC Managed Care – PPO | Admitting: Family Medicine

## 2019-06-10 VITALS — BP 102/64 | HR 72 | Temp 95.8°F | Resp 18 | Ht 62.0 in | Wt 134.6 lb

## 2019-06-10 DIAGNOSIS — H6122 Impacted cerumen, left ear: Secondary | ICD-10-CM | POA: Diagnosis not present

## 2019-06-10 NOTE — Progress Notes (Signed)
Chief Complaint  Patient presents with  . Otalgia    left ear --- ear fullness    Subjective: Patient is a 34 y.o. female here for hearing loss L ear.  Her mother is present for the exam.  L ear hearing loss several mo ago.  Is progressively been getting worse.  She does use Q-tips.  She is not having any pain or drainage.  Denies congestion.  Past Medical History:  Diagnosis Date  . Anxiety   . Depression   . History of chicken pox   . Infection    UTI  . Medical history non-contributory     Objective: BP 102/64 (BP Location: Left Arm, Patient Position: Sitting, Cuff Size: Normal)   Pulse 72   Temp (!) 95.8 F (35.4 C) (Temporal)   Resp 18   Ht 5\' 2"  (1.575 m)   Wt 134 lb 9.6 oz (61.1 kg)   SpO2 98%   BMI 24.62 kg/m  General: Awake, appears stated age HEENT: Right canal patent, TM unremarkable; left canal 100% obstructed with cerumen Lungs: No accessory muscle use Psych: Age appropriate judgment and insight, normal affect and mood  Assessment and Plan: Impacted cerumen of left ear  Aftercare instructions verbalized and written down.  Counseled on avoiding Q-tips. Wax successfully removed with irrigation.  Follow-up as needed. The patient voiced understanding and agreement to the plan.  [place], DO 06/10/19  2:31 PM

## 2019-06-10 NOTE — Patient Instructions (Signed)
OK to use Debrox (peroxide) in the ear to loosen up wax. Also recommend using a bulb syringe (for removing boogers from baby's noses) to flush through warm water. Do not use Q-tips as this can impact wax further.  Let us know if you need anything. 

## 2019-06-23 NOTE — Progress Notes (Signed)
[facility] [address] [place], [place] [address] [phone]  Date:  06/25/2019   Name:  Janet Martinez   DOB:  Aug 26, 1985   MRN:  [id number]  PCP:  Darreld Mclean, MD    Chief Complaint: Medication Refill   History of Present Illness:  Janet Martinez is a 34 y.o. very pleasant female patient who presents with the following:  Patient here today for a follow-up visit-last seen by myself in February 2020 She recently delivered a baby boy at term on February 4 following a couple of very difficult pregnancy losses She also has a daughter who is now 20 and in good health Her daughter is excited about her baby brother   She is taking sertraline 100 mg currently and doing well on this dosage.  Does need a refill today Her baby is now 62 months old.  She nursed for about 2 months, then changed to formula due to lack of supply. She would like to go back on OCP- she does not recall exactly what OCP she used in the past She has not had intercourse yet since the baby was born, but she is again menstruating Her LMP was 2-3 weeks ago   No history of HTN, DVT or PE She does occasionally get migraine headache She last used oral contraceptive pills about 5 to 6 years ago, she was taking combined pills at that time  Patient Active Problem List   Diagnosis Date Noted  . Encounter for planned induction of labor 03/27/2019  . Postpartum care following vaginal delivery 2/4 03/27/2019  . Perineal laceration, second degree 03/27/2019  . Neck pain 05/31/2017  . Migraine 04/04/2016  . Hydrops fetalis   . Hydrops fetalis in second trimester, antepartum   . 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 Abnormal fetal ultrasound   . Nonallopathic lesion-rib cage 12/02/2013  . Nonallopathic lesion of cervical region 03/17/2013  . Nonallopathic lesion of lumbosacral region 03/17/2013  . Nonallopathic lesion of thoracic region 03/17/2013  . SVD  (spontaneous vaginal delivery) 02/08/2011    Past Medical History:  Diagnosis Date  . Anxiety   . Depression   . History of chicken pox   . Infection    UTI  . Medical history non-contributory     Past Surgical History:  Procedure Laterality Date  . NO PAST SURGERIES    . WISDOM TOOTH EXTRACTION      Social History   Tobacco Use  . Smoking status: Never Smoker  . Smokeless tobacco: Never Used  Substance Use Topics  . Alcohol use: Not Currently  . Drug use: No    Family History  Problem Relation Age of Onset  . Mental illness Sister        anxiety  . Seizures Brother   . Cancer Paternal Grandmother        ovarian  . Miscarriages / [place] Mother   . Cancer Paternal Aunt        multiple types  . Breast cancer Other     No Known Allergies  Medication list has been reviewed and updated.  Current Outpatient Medications on File Prior to Visit  Medication Sig Dispense Refill  . busPIRone (BUSPAR) 5 MG tablet Take 5 mg by mouth daily as needed (For anxiety).     . magnesium oxide (MAG-OX) 400 MG tablet Take 400 mg by mouth at bedtime.    . Omega-3 Fatty Acids (FISH OIL) 1000  MG CAPS Take 2 capsules by mouth at bedtime.    . Prenatal Vit-Fe Fumarate-FA (PRENATAL MULTIVITAMIN) TABS tablet Take 1 tablet by mouth at bedtime.    . Probiotic CAPS Take 1 capsule by mouth daily.     No current facility-administered medications on file prior to visit.    Review of Systems:  As per HPI- otherwise negative.   Physical Examination: Vitals:   06/25/19 1043  BP: 108/68  Pulse: 78  Resp: 16  Temp: (!) 95 F (35 C)  SpO2: 98%   Vitals:   06/25/19 1043  Weight: 130 lb (59 kg)  Height: 5\' 2"  (1.575 m)   Body mass index is 23.78 kg/m. Ideal Body Weight: Weight in (lb) to have BMI = 25: 136.4  GEN: no acute distress.  Healthy-appearing young woman.  Here today with her adorable children- infant son and school-aged daughter HEENT: Atraumatic, Normocephalic.   Ears and Nose: No external deformity. CV: RRR, No M/G/R. No JVD. No thrill. No extra heart sounds. PULM: CTA B, no wheezes, crackles, rhonchi. No retractions. No resp. distress. No accessory muscle use. EXTR: No c/c/e PSYCH: Normally interactive. Conversant.    Assessment and Plan: Surveillance of contraceptive pill - Plan: norethindrone (ORTHO MICRONOR) 0.35 MG tablet  Adjustment disorder with mixed anxiety and depressed mood - Plan: sertraline (ZOLOFT) 100 MG tablet  Here today to discuss 2 issues.  Patient has been using sertraline for anxiety and depression, she is not currently nursing so okay to continue this medication.  She is happy with her current dose, I refilled for 1 year  She delivered about 3 months ago, would like to start back on contraception.  On discussion, she does get migraine headache with aura.  We discussed this and I advised that a progesterone only contraceptive is preferable due to increased stroke risk with estrogen.  We will try Ortho Micronor, she will let me know how she likes this.   She expects her next menstrual period about 1 week, advised that she can start her birth control pill on the Sunday after bleeding begins.  She may also start it now if she prefers  Moderate medical decision making today  This visit occurred during the SARS-CoV-2 public health emergency.  Safety protocols were in place, including screening questions prior to the visit, additional usage of staff PPE, and extensive cleaning of exam room while observing appropriate contact time as indicated for disinfecting solutions.     Signed Lamar Blinks, MD

## 2019-06-25 ENCOUNTER — Other Ambulatory Visit: Payer: Self-pay

## 2019-06-25 ENCOUNTER — Encounter: Payer: Self-pay | Admitting: Family Medicine

## 2019-06-25 ENCOUNTER — Ambulatory Visit (INDEPENDENT_AMBULATORY_CARE_PROVIDER_SITE_OTHER): Payer: BC Managed Care – PPO | Admitting: Family Medicine

## 2019-06-25 VITALS — BP 108/68 | HR 78 | Temp 95.0°F | Resp 16 | Ht 62.0 in | Wt 130.0 lb

## 2019-06-25 DIAGNOSIS — Z3041 Encounter for surveillance of contraceptive pills: Secondary | ICD-10-CM | POA: Diagnosis not present

## 2019-06-25 DIAGNOSIS — F4323 Adjustment disorder with mixed anxiety and depressed mood: Secondary | ICD-10-CM | POA: Diagnosis not present

## 2019-06-25 MED ORDER — SERTRALINE HCL 100 MG PO TABS: 100.0000 mg | ORAL_TABLET | Freq: Every day | ORAL | 0 refills | Status: DC

## 2019-06-25 MED ORDER — NORETHINDRONE 0.35 MG PO TABS: 1.0000 | ORAL_TABLET | Freq: Every day | ORAL | 4 refills | Status: DC

## 2019-06-25 NOTE — Patient Instructions (Signed)
Great to see you again today!  We will try a progesterone only BC pill for you- this is safer in women who experience migraine with aura.  Let me know if it does not agree with you.  You can start on the Sunday after you next period begins if you like  Let me know if any questions or concerns

## 2019-10-09 ENCOUNTER — Other Ambulatory Visit: Payer: Self-pay | Admitting: Family Medicine

## 2019-10-09 DIAGNOSIS — F4323 Adjustment disorder with mixed anxiety and depressed mood: Secondary | ICD-10-CM

## 2019-10-09 MED ORDER — SERTRALINE HCL 100 MG PO TABS: 100.0000 mg | ORAL_TABLET | Freq: Every day | ORAL | 0 refills | Status: DC

## 2020-01-30 ENCOUNTER — Ambulatory Visit (INDEPENDENT_AMBULATORY_CARE_PROVIDER_SITE_OTHER): Payer: BC Managed Care – PPO | Admitting: Family Medicine

## 2020-01-30 ENCOUNTER — Other Ambulatory Visit: Payer: Self-pay

## 2020-01-30 ENCOUNTER — Encounter: Payer: Self-pay | Admitting: Family Medicine

## 2020-01-30 VITALS — BP 112/68 | HR 77 | Temp 98.1°F | Resp 18 | Ht 62.0 in | Wt 141.0 lb

## 2020-01-30 DIAGNOSIS — H6122 Impacted cerumen, left ear: Secondary | ICD-10-CM

## 2020-01-30 MED ORDER — FLUTICASONE PROPIONATE 50 MCG/ACT NA SUSP: 2.0000 | Freq: Every day | NASAL | 2 refills | Status: DC

## 2020-01-30 NOTE — Progress Notes (Signed)
CC: Clogged L ear  Subjective: Patient is a 34 y.o. female here for L ear fullness.  Ear fullness for several days. Having difficulty hearing. Does not use Q tips. No pain or drainage.   Past Medical History:  Diagnosis Date  . Anxiety   . Depression   . History of chicken pox   . Infection    UTI  . Medical history non-contributory     Objective: BP 112/68   Pulse 77   Temp 98.1 F (36.7 C) (Oral)   Resp 18   Ht 5\' 2"  (1.575 m)   Wt 141 lb (64 kg)   SpO2 98%   BMI 25.79 kg/m  General: Awake, appears stated age HEENT: R canal patent, TM neg; L canal 100% obstructed w cerumen Lungs: No accessory muscle use Psych: Age appropriate judgment and insight, normal affect and mood  Assessment and Plan: Impacted cerumen of left ear  Successfully removed. Home care instructions verbalized and written down.  The patient voiced understanding and agreement to the plan.  [place], DO 01/30/20  1:29 PM

## 2020-01-30 NOTE — Patient Instructions (Signed)
OK to use Debrox (peroxide) in the ear to loosen up wax. Also recommend using a bulb syringe (for removing boogers from baby's noses) to flush through warm water. Do not use Q-tips as this can impact wax further.  Let us know if you need anything.

## 2020-02-24 ENCOUNTER — Telehealth (INDEPENDENT_AMBULATORY_CARE_PROVIDER_SITE_OTHER): Payer: BC Managed Care – PPO | Admitting: Family Medicine

## 2020-02-24 ENCOUNTER — Other Ambulatory Visit: Payer: Self-pay

## 2020-02-24 ENCOUNTER — Encounter: Payer: Self-pay | Admitting: Family Medicine

## 2020-02-24 DIAGNOSIS — J0100 Acute maxillary sinusitis, unspecified: Secondary | ICD-10-CM

## 2020-02-24 DIAGNOSIS — J069 Acute upper respiratory infection, unspecified: Secondary | ICD-10-CM | POA: Diagnosis not present

## 2020-02-24 MED ORDER — AMOXICILLIN-POT CLAVULANATE 875-125 MG PO TABS: 1.0000 | ORAL_TABLET | Freq: Two times a day (BID) | ORAL | 0 refills | Status: DC

## 2020-02-24 MED ORDER — PROMETHAZINE-DM 6.25-15 MG/5ML PO SYRP: 5.0000 mL | ORAL_SOLUTION | Freq: Four times a day (QID) | ORAL | 0 refills | Status: DC | PRN

## 2020-02-24 MED ORDER — PREDNISONE 20 MG PO TABS: 40.0000 mg | ORAL_TABLET | Freq: Every day | ORAL | 0 refills | Status: AC

## 2020-02-24 NOTE — Progress Notes (Signed)
Chief Complaint  Patient presents with  . congestion nasal/chest  . Headache  . Cough    Janet Martinez here for URI complaints. Due to COVID-19 pandemic, we are interacting via web portal for an electronic face-to-face visit. I verified patient's ID using 2 identifiers. Patient agreed to proceed with visit via this method. Patient is at home, I am at office. Patient, her spouse, and I are present for visit.   Duration: 4 days  Associated symptoms: Fever (101 F), sinus congestion, sinus pain, loss of smell, rhinorrhea, myalgia and cough Denies: itchy watery eyes, ear pain, ear drainage, sore throat, wheezing, shortness of breath and N/V/D  Treatment to date: Dayquil, Nyquil, Mucinex Sick contacts: Yes- children  Past Medical History:  Diagnosis Date  . Anxiety   . Depression   . History of chicken pox   . Infection    UTI  . Medical history non-contributory    Exam No conversational dyspnea Age appropriate judgment and insight Nml affect and mood  Acute maxillary sinusitis, recurrence not specified - Plan: predniSONE (DELTASONE) 20 MG tablet  Upper respiratory tract infection, unspecified type - Plan: predniSONE (DELTASONE) 20 MG tablet, promethazine-dextromethorphan (PROMETHAZINE-DM) 6.25-15 MG/5ML syrup  I think she has Covid.  We will do a 5-day prednisone burst at 40 mg daily in addition to a cough syrup.  I sent in Augmentin though she may not need this if she tests negative.  Of note, she is not currently breast-feeding. Continue to push fluids, practice good hand hygiene, cover mouth when coughing. F/u prn. If starting to experience replaceable fluid loss, shaking, or shortness of breath, seek immediate care. Pt voiced understanding and agreement to the plan.  Jilda Roche [place], DO 02/24/20 11:53 AM

## 2020-02-25 MED ORDER — PROMETHAZINE-CODEINE 6.25-10 MG/5ML PO SYRP: 5.0000 mL | ORAL_SOLUTION | Freq: Four times a day (QID) | ORAL | 0 refills | Status: DC | PRN

## 2020-02-25 NOTE — Addendum Note (Signed)
Addended by: Radene Gunning on: 02/25/2020 05:45 PM   Modules accepted: Orders

## 2020-03-08 ENCOUNTER — Encounter: Payer: Self-pay | Admitting: Family Medicine

## 2020-03-08 DIAGNOSIS — F4323 Adjustment disorder with mixed anxiety and depressed mood: Secondary | ICD-10-CM

## 2020-03-08 MED ORDER — SERTRALINE HCL 100 MG PO TABS: 100.0000 mg | ORAL_TABLET | Freq: Every day | ORAL | 0 refills | Status: DC

## 2020-07-20 NOTE — Progress Notes (Signed)
[place] at [facility] [address] [place], [place] [address] [phone]  Date:  07/22/2020   Name:  Janet Martinez   DOB:  1985/03/16   MRN:  [id number]  PCP:  Darreld Mclean, MD    Chief Complaint: No chief complaint on file.   History of Present Illness:  Janet Martinez is a 35 y.o. very pleasant female patient who presents with the following:  Virtual visit today to discuss medication refills Patient location is home, my location is office.  Patient identity confirmed with 2 factors, she gives consent for virtual visit today.  The patient and myself are present on the call today Most recent visit with myself about one year ago  She has a 44 yo daughter and 11 yo son  She has used sertraline for depression successfully, needs a refill.  However, she would be interested in trying to decrease her dose from 100 to 50 mg  She is sleeping pretty well-she does have a 37-year-old, her energy is good and anxiety is under control  she is not nursing or pregnant at this time Patient Active Problem List   Diagnosis Date Noted  . Encounter for planned induction of labor 03/27/2019  . Postpartum care following vaginal delivery 2/4 03/27/2019  . Perineal laceration, second degree 03/27/2019  . Neck pain 05/31/2017  . Migraine 04/04/2016  . Hydrops fetalis   . Hydrops fetalis in second trimester, antepartum   . 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 Abnormal fetal ultrasound   . Nonallopathic lesion-rib cage 12/02/2013  . Nonallopathic lesion of cervical region 03/17/2013  . Nonallopathic lesion of lumbosacral region 03/17/2013  . Nonallopathic lesion of thoracic region 03/17/2013  . SVD (spontaneous vaginal delivery) 02/08/2011    Past Medical History:  Diagnosis Date  . Anxiety   . Depression   . History of chicken pox   . Infection    UTI  . Medical history non-contributory     Past Surgical History:  Procedure Laterality  Date  . NO PAST SURGERIES    . WISDOM TOOTH EXTRACTION      Social History   Tobacco Use  . Smoking status: Never Smoker  . Smokeless tobacco: Never Used  Vaping Use  . Vaping Use: Never used  Substance Use Topics  . Alcohol use: Not Currently  . Drug use: No    Family History  Problem Relation Age of Onset  . Mental illness Sister        anxiety  . Seizures Brother   . Cancer Paternal Grandmother        ovarian  . Miscarriages / [place] Mother   . Cancer Paternal Aunt        multiple types  . Breast cancer Other     No Known Allergies  Medication list has been reviewed and updated.  Current Outpatient Medications on File Prior to Visit  Medication Sig Dispense Refill  . amoxicillin-clavulanate (AUGMENTIN) 875-125 MG tablet Take 1 tablet by mouth 2 (two) times daily. 20 tablet 0  . busPIRone (BUSPAR) 5 MG tablet Take 5 mg by mouth daily as needed (For anxiety).     . fluticasone (FLONASE) 50 MCG/ACT nasal spray Place 2 sprays into both nostrils daily. 16 g 2  . magnesium oxide (MAG-OX) 400 MG tablet Take 400 mg by mouth at bedtime.    . norethindrone (ORTHO MICRONOR) 0.35 MG tablet Take 1 tablet (0.35 mg total) by mouth daily.  3 Package 4  . Omega-3 Fatty Acids (FISH OIL) 1000 MG CAPS Take 2 capsules by mouth at bedtime.    . Prenatal Vit-Fe Fumarate-FA (PRENATAL MULTIVITAMIN) TABS tablet Take 1 tablet by mouth at bedtime.    . Probiotic CAPS Take 1 capsule by mouth daily.    . promethazine-codeine (PHENERGAN WITH CODEINE) 6.25-10 MG/5ML syrup Take 5-10 mLs by mouth every 6 (six) hours as needed for cough. 120 mL 0  . sertraline (ZOLOFT) 100 MG tablet Take 1 tablet (100 mg total) by mouth daily. 90 tablet 0   No current facility-administered medications on file prior to visit.    Review of Systems:  As per HPI- otherwise negative.   Physical Examination: There were no vitals filed for this visit. There were no vitals filed for this visit. There is no  height or weight on file to calculate BMI. Ideal Body Weight:    Patient observed her video monitor.  She looks well, no shortness of breath or distress is noted  Assessment and Plan: Adjustment disorder with mixed anxiety and depressed mood - Plan: sertraline (ZOLOFT) 50 MG tablet  Visit today to discuss medication refill.  [name] is using sertraline for anxiety and depression.  She has been on 100 mg, but would like to try decreasing to 50.  I sent in a prescription for 50 mg of sertraline.  She will let me know if any problems or if she needs her dose to be increased again  Video used for duration of visit today  Signed Lamar Blinks, MD

## 2020-07-22 ENCOUNTER — Telehealth (INDEPENDENT_AMBULATORY_CARE_PROVIDER_SITE_OTHER): Payer: BC Managed Care – PPO | Admitting: Family Medicine

## 2020-07-22 ENCOUNTER — Other Ambulatory Visit: Payer: Self-pay

## 2020-07-22 DIAGNOSIS — F4323 Adjustment disorder with mixed anxiety and depressed mood: Secondary | ICD-10-CM | POA: Diagnosis not present

## 2020-07-22 MED ORDER — SERTRALINE HCL 50 MG PO TABS: 50.0000 mg | ORAL_TABLET | Freq: Every day | ORAL | 3 refills | Status: DC

## 2020-09-22 ENCOUNTER — Other Ambulatory Visit: Payer: Self-pay

## 2020-09-22 DIAGNOSIS — F4323 Adjustment disorder with mixed anxiety and depressed mood: Secondary | ICD-10-CM

## 2020-09-22 MED ORDER — SERTRALINE HCL 100 MG PO TABS: 100.0000 mg | ORAL_TABLET | Freq: Every day | ORAL | 1 refills | Status: DC

## 2020-10-12 ENCOUNTER — Encounter: Payer: Self-pay | Admitting: Family Medicine

## 2020-10-12 ENCOUNTER — Other Ambulatory Visit: Payer: Self-pay

## 2020-10-12 ENCOUNTER — Ambulatory Visit (INDEPENDENT_AMBULATORY_CARE_PROVIDER_SITE_OTHER): Payer: BC Managed Care – PPO | Admitting: Family Medicine

## 2020-10-12 VITALS — BP 110/72 | HR 77 | Temp 98.3°F | Resp 18 | Ht 62.0 in | Wt 140.0 lb

## 2020-10-12 DIAGNOSIS — H6982 Other specified disorders of Eustachian tube, left ear: Secondary | ICD-10-CM

## 2020-10-12 MED ORDER — PREDNISONE 20 MG PO TABS: 40.0000 mg | ORAL_TABLET | Freq: Every day | ORAL | 0 refills | Status: AC

## 2020-10-12 NOTE — Progress Notes (Signed)
Chief Complaint  Patient presents with   Ear Pain    Left     Pt is here for left ear pain. Duration: 1  d Progression:  unchanged Associated symptoms: had a cold last week Denies: URI s/s's, fevers, outer ear pain, bleeding, or discharge from ear Treatment to date: none  Past Medical History:  Diagnosis Date   Anxiety    Depression    History of chicken pox    Infection    UTI   Medical history non-contributory     BP 110/72   Pulse 77   Temp 98.3 F (36.8 C)   Resp 18   Ht '5\' 2"'$  (1.575 m)   Wt 140 lb (63.5 kg)   SpO2 95%   BMI 25.61 kg/m  General: Awake, alert, appearing stated age HEENT:  L ear- Canal initially 100% obstructed w cerumen; after lavage patent without drainage or erythema, TM is neg R ear- canal patent without drainage or erythema, TM is neg Nose- nares patent and without discharge Mouth- Lips, gums and dentition unremarkable, pharynx is without erythema or exudate Neck: No adenopathy Lungs: Normal effort, no accessory muscle use Psych: Age appropriate judgment and insight, normal mood and affect  Dysfunction of left eustachian tube - Plan: predniSONE (DELTASONE) 20 MG tablet  5 d pred burst, 40 mg/d. INCS for future issues. No signs of infection, will let me know if things change.  F/u prn.  Pt voiced understanding and agreement to the plan.  [place], DO 10/12/20 4:51 PM

## 2020-10-12 NOTE — Patient Instructions (Addendum)
OK to take Tylenol 1000 mg (2 extra strength tabs) or 975 mg (3 regular strength tabs) every 6 hours as needed.  Consider using a nasal spray if this happens again as this could prevent worsening.   Let us know if you need anything.

## 2020-12-31 ENCOUNTER — Encounter: Payer: Self-pay | Admitting: Family Medicine

## 2020-12-31 ENCOUNTER — Telehealth (INDEPENDENT_AMBULATORY_CARE_PROVIDER_SITE_OTHER): Payer: BC Managed Care – PPO | Admitting: Family Medicine

## 2020-12-31 DIAGNOSIS — J069 Acute upper respiratory infection, unspecified: Secondary | ICD-10-CM | POA: Diagnosis not present

## 2020-12-31 MED ORDER — AZITHROMYCIN 250 MG PO TABS: ORAL_TABLET | ORAL | 0 refills | Status: DC

## 2020-12-31 MED ORDER — PROMETHAZINE-CODEINE 6.25-10 MG/5ML PO SYRP: 5.0000 mL | ORAL_SOLUTION | Freq: Four times a day (QID) | ORAL | 0 refills | Status: DC | PRN

## 2020-12-31 MED ORDER — HYDROCODONE BIT-HOMATROP MBR 5-1.5 MG/5ML PO SOLN: 5.0000 mL | Freq: Three times a day (TID) | ORAL | 0 refills | Status: DC | PRN

## 2020-12-31 NOTE — Progress Notes (Signed)
CC: URI  Janet Martinez here for URI complaints. Due to COVID-19 pandemic, we are interacting via web portal for an electronic face-to-face visit. I verified patient's ID using 2 identifiers. Patient agreed to proceed with visit via this method. Patient is at home, I am at office. Patient and I are present for visit.   Duration: 3 days  Associated symptoms: sinus headache, sinus congestion, rhinorrhea, shortness of breath, myalgia, and coughing Denies: sinus pain, itchy watery eyes, ear pain, ear drainage, wheezing, and fevers. N/V/D, loss of taste/smell Treatment to date: [place], Mucinex Sick contacts: Yes; spouse Spouse tested neg for covid.   Past Medical History:  Diagnosis Date   Anxiety    Depression    History of chicken pox    Infection    UTI   Medical history non-contributory    Objective No conversational dyspnea Age appropriate judgment and insight Nml affect and mood  Upper respiratory tract infection, unspecified type - Plan: promethazine-codeine (PHENERGAN WITH CODEINE) 6.25-10 MG/5ML syrup  Supportive care for now, spouse getting better w same tx. If no better or worsening, pocket rx for Zpak provided.  Continue to push fluids, practice good hand hygiene, cover mouth when coughing. F/u prn. If starting to experience fevers, shaking, or shortness of breath, seek immediate care. Pt voiced understanding and agreement to the plan.  [place], DO 12/31/20 8:11 AM

## 2020-12-31 NOTE — Addendum Note (Signed)
Addended by: Ames Coupe on: 12/31/2020 11:09 AM   Modules accepted: Orders

## 2021-01-05 ENCOUNTER — Other Ambulatory Visit: Payer: Self-pay | Admitting: Family Medicine

## 2021-01-05 MED ORDER — HYDROCODONE BIT-HOMATROP MBR 5-1.5 MG/5ML PO SOLN: 5.0000 mL | Freq: Three times a day (TID) | ORAL | 0 refills | Status: DC | PRN

## 2021-02-07 ENCOUNTER — Telehealth (INDEPENDENT_AMBULATORY_CARE_PROVIDER_SITE_OTHER): Payer: BC Managed Care – PPO | Admitting: Family Medicine

## 2021-02-07 ENCOUNTER — Other Ambulatory Visit: Payer: Self-pay

## 2021-02-07 ENCOUNTER — Encounter: Payer: Self-pay | Admitting: Family Medicine

## 2021-02-07 DIAGNOSIS — R300 Dysuria: Secondary | ICD-10-CM

## 2021-02-07 MED ORDER — SULFAMETHOXAZOLE-TRIMETHOPRIM 800-160 MG PO TABS: 1.0000 | ORAL_TABLET | Freq: Two times a day (BID) | ORAL | 0 refills | Status: AC

## 2021-02-07 NOTE — Progress Notes (Signed)
CC: Dysuria  Janet Martinez is a 35 y.o. female here for possible UTI. Due to COVID-19 pandemic, we are interacting via web portal for an electronic face-to-face visit. I verified patient's ID using 2 identifiers. Patient agreed to proceed with visit via this method. Patient is at home, I am at office. Patient and I are present for visit.   Duration: 2 days. Symptoms: Dysuria, urinary frequency, urinary retention, and urgency Denies: hematuria, urinary hesitancy, fever, nausea, vomiting, and flank pain, vaginal discharge Hx of recurrent UTI? No Denies new sexual partners.  Past Medical History:  Diagnosis Date   Anxiety    Depression    History of chicken pox    Infection    UTI   Medical history non-contributory      Objective No conversational dyspnea Age appropriate judgment and insight Nml affect and mood  Dysuria - Plan: sulfamethoxazole-trimethoprim (BACTRIM DS) 800-160 MG tablet  Stay hydrated. Seek immediate care if pt starts to develop fevers, new/worsening symptoms, uncontrollable N/V. F/u prn. The patient voiced understanding and agreement to the plan.  [place], DO 02/07/21 4:48 PM

## 2021-03-27 ENCOUNTER — Other Ambulatory Visit: Payer: Self-pay | Admitting: Family Medicine

## 2021-03-27 DIAGNOSIS — F4323 Adjustment disorder with mixed anxiety and depressed mood: Secondary | ICD-10-CM

## 2021-07-21 ENCOUNTER — Other Ambulatory Visit: Payer: Self-pay

## 2021-07-21 DIAGNOSIS — F4323 Adjustment disorder with mixed anxiety and depressed mood: Secondary | ICD-10-CM

## 2021-07-21 MED ORDER — SERTRALINE HCL 100 MG PO TABS: 100.0000 mg | ORAL_TABLET | Freq: Every day | ORAL | 1 refills | Status: DC

## 2021-11-03 ENCOUNTER — Other Ambulatory Visit: Payer: Self-pay | Admitting: Family Medicine

## 2021-11-03 DIAGNOSIS — F4323 Adjustment disorder with mixed anxiety and depressed mood: Secondary | ICD-10-CM

## 2021-11-28 ENCOUNTER — Encounter: Payer: Self-pay | Admitting: Family Medicine

## 2021-11-28 ENCOUNTER — Telehealth (INDEPENDENT_AMBULATORY_CARE_PROVIDER_SITE_OTHER): Payer: BC Managed Care – PPO | Admitting: Family Medicine

## 2021-11-28 DIAGNOSIS — S29012A Strain of muscle and tendon of back wall of thorax, initial encounter: Secondary | ICD-10-CM

## 2021-11-28 DIAGNOSIS — J069 Acute upper respiratory infection, unspecified: Secondary | ICD-10-CM

## 2021-11-28 MED ORDER — MELOXICAM 15 MG PO TABS: 15.0000 mg | ORAL_TABLET | Freq: Every day | ORAL | 0 refills | Status: DC

## 2021-11-28 MED ORDER — HYDROCODONE BIT-HOMATROP MBR 5-1.5 MG/5ML PO SOLN: 5.0000 mL | Freq: Three times a day (TID) | ORAL | 0 refills | Status: DC | PRN

## 2021-11-28 MED ORDER — CYCLOBENZAPRINE HCL 10 MG PO TABS: 5.0000 mg | ORAL_TABLET | Freq: Three times a day (TID) | ORAL | 0 refills | Status: DC | PRN

## 2021-11-28 NOTE — Progress Notes (Signed)
Chief Complaint  Patient presents with   Cough    Janet Martinez here for URI complaints. Due to COVID-19 pandemic, we are interacting via web portal for an electronic face-to-face visit. I verified patient's ID using 2 identifiers. Patient agreed to proceed with visit via this method. Patient is at home, I am at office. Patient and I are present for visit.   Duration: 1 week  Associated symptoms: sinus congestion, rhinorrhea, and coughing Denies: sinus pain, itchy watery eyes, ear pain, ear drainage, sore throat, wheezing, shortness of breath, myalgia, and fevers Treatment to date: Nyquil, Mucinex, OTC decongestants Sick contacts: Yes; son  Tweaked upper L back this AM. Woke up in pain and then picked up her toddler son that made things worse.  No bruising, redness, swelling of the back. Good ROM of L shoulder.   Past Medical History:  Diagnosis Date   Anxiety    Depression    History of chicken pox    Infection    UTI   Medical history non-contributory     Objective No conversational dyspnea Age appropriate judgment and insight Nml affect and mood  Viral URI with cough - Plan: HYDROcodone bit-homatropine (HYCODAN) 5-1.5 MG/5ML syrup  Strain of mid-back, initial encounter - Plan: meloxicam (MOBIC) 15 MG tablet, cyclobenzaprine (FLEXERIL) 10 MG tablet  Syrup prn. She has been on in past and did well. Continue to push fluids, practice good hand hygiene, cover mouth when coughing. F/u prn. If starting to experience fevers, shaking, or shortness of breath, seek immediate care. Heat, ice, Tylenol, Mobic, Flexeril as she has done well in past. Stretches/exercises. F/u prn.  Pt voiced understanding and agreement to the plan.  [place], DO 11/28/21 11:32 AM

## 2021-11-30 ENCOUNTER — Ambulatory Visit: Payer: BC Managed Care – PPO | Admitting: Family Medicine

## 2021-12-03 NOTE — Patient Instructions (Incomplete)
It was great to see you again today, I will be in touch with your labs Recommend latest COVID shot this fall I think the spot on your left breast is just a skin lesion that could have formed anywhere- however we will be more cautious due to its location  Use the steroid cream 1-2x a day for 1-2 weeks.  If the lesion is NOT clearly going away within 2 weeks please let me know and we will look further.  In that case we can have you see dermatology or do a biopsy of the skin, AND set up breast imaging  Take care!

## 2021-12-03 NOTE — Progress Notes (Addendum)
[facility] at [facility] [address] [place], [place] [address] [phone]  Date:  12/05/2021   Name:  Janet Martinez   DOB:  23-Sep-1985   MRN:  [id number]  PCP:  Pearline Cables, MD    Chief Complaint: red mark on left breast  (Going on for 2 months )   History of Present Illness:  Janet Martinez is a 36 y.o. very pleasant female patient who presents with the following:  Seen today for a follow-up visit/breast concern Most recently seen by myself in May 2021, video visit June 2022  She has an 25 year old daughter and 79-year-old son  Screening labs can be updated today Flu shot-declines Recommend COVID booster  About 2 months ago she noted a small skin lesion on her lateral left breast- it has changed some over the last several weeks  May look more inflamed at times, then will up, Not painful or particularly itchy Never had this in the past  She is otherwise feeling fine  No masses or bumps, no lymphadenopathy noted  She has not had a mammo so far  Patient Active Problem List   Diagnosis Date Noted   Encounter for planned induction of labor 03/27/2019   Postpartum care following vaginal delivery 2/4 03/27/2019   Perineal laceration, second degree 03/27/2019   Neck pain 05/31/2017   Migraine 04/04/2016   Hydrops fetalis    Hydrops fetalis in second trimester, antepartum    Fetal cystic hygroma 01/14/2014   Abnormal fetal ultrasound    Nonallopathic lesion-rib cage 12/02/2013   Nonallopathic lesion of cervical region 03/17/2013   Nonallopathic lesion of lumbosacral region 03/17/2013   Nonallopathic lesion of thoracic region 03/17/2013   SVD (spontaneous vaginal delivery) 02/08/2011    Past Medical History:  Diagnosis Date   Anxiety    Depression    History of chicken pox    Infection    UTI   Medical history non-contributory     Past Surgical History:  Procedure Laterality Date   NO PAST SURGERIES      WISDOM TOOTH EXTRACTION      Social History   Tobacco Use   Smoking status: Never   Smokeless tobacco: Never  Vaping Use   Vaping Use: Never used  Substance Use Topics   Alcohol use: Not Currently   Drug use: No    Family History  Problem Relation Age of Onset   Mental illness Sister        anxiety   Seizures Brother    Cancer Paternal Grandmother        ovarian   Miscarriages / [place] Mother    Cancer Paternal Aunt        multiple types   Breast cancer Other     No Known Allergies  Medication list has been reviewed and updated.  Current Outpatient Medications on File Prior to Visit  Medication Sig Dispense Refill   sertraline (ZOLOFT) 100 MG tablet TAKE 1 TABLET BY MOUTH EVERY DAY 90 tablet 1   No current facility-administered medications on file prior to visit.    Review of Systems:  As per HPI- otherwise negative.   Physical Examination: Vitals:   12/05/21 1556  BP: 120/60  Pulse: 61  Temp: 97.7 F (36.5 C)  SpO2: 98%   Vitals:   12/05/21 1556  Weight: 143 lb 12.8 oz (65.2 kg)  Height: 5\' 2"  (1.575 m)   Body mass index is 26.3 kg/m.  Ideal Body Weight: Weight in (lb) to have BMI = 25: 136.4  GEN: no acute distress.  Mild overweight, looks well  HEENT: Atraumatic, Normocephalic.  Ears and Nose: No external deformity. CV: RRR, No M/G/R. No JVD. No thrill. No extra heart sounds. PULM: CTA B, no wheezes, crackles, rhonchi. No retractions. No resp. distress. No accessory muscle use. ABD: S, NT, ND, +BS. No rebound. No HSM. EXTR: No c/c/e PSYCH: Normally interactive. Conversant.  Breast exam: Normal breast tissue with no masses or dimpling.  At approximately 3:00 on the left breast there is a 4 mm diameter area of slightly raised and scaly skin, appears to be a nonspecific skin lesion in healing stages  Assessment and Plan: Screening for deficiency anemia - Plan: CBC  Screening for diabetes mellitus - Plan: Comprehensive metabolic panel,  Hemoglobin A1c  Screening for hyperlipidemia - Plan: Lipid panel  Screening for thyroid disorder - Plan: TSH  Encounter for hepatitis C screening test for low risk patient - Plan: Hepatitis C antibody  Skin nodule - Plan: triamcinolone cream (KENALOG) 0.1 %  Patient seen today with concern of a skin change on her breast.  Advised that I think this is a nonspecific skin finding, unrelated to its location on the breast.  However, certainly if it does not go away we will look further.  We will have her start on triamcinolone once or twice a day for 1 to 2 weeks.  If not cleared up she will let me know Will plan further follow- up pending labs.   Signed Abbe Amsterdam, MD   Addnd 10/17- received labs as below, message to pt  Results for orders placed or performed in visit on 12/05/21  CBC  Result Value Ref Range   WBC 9.0 4.0 - 10.5 K/uL   RBC 4.61 3.87 - 5.11 Mil/uL   Platelets 197.0 150.0 - 400.0 K/uL   Hemoglobin 14.7 12.0 - 15.0 g/dL   HCT 16.1 09.6 - 04.5 %   MCV 95.9 78.0 - 100.0 fl   MCHC 33.2 30.0 - 36.0 g/dL   RDW 40.9 81.1 - 91.4 %  Comprehensive metabolic panel  Result Value Ref Range   Sodium 137 135 - 145 mEq/L   Potassium 4.1 3.5 - 5.1 mEq/L   Chloride 99 96 - 112 mEq/L   CO2 29 19 - 32 mEq/L   Glucose, Bld 87 70 - 99 mg/dL   BUN 18 6 - 23 mg/dL   Creatinine, Ser 7.82 0.40 - 1.20 mg/dL   Total Bilirubin 0.6 0.2 - 1.2 mg/dL   Alkaline Phosphatase 55 39 - 117 U/L   AST 20 0 - 37 U/L   ALT 18 0 - 35 U/L   Total Protein 7.6 6.0 - 8.3 g/dL   Albumin 4.8 3.5 - 5.2 g/dL   GFR 956.21 >30.86 mL/min   Calcium 10.0 8.4 - 10.5 mg/dL  Hemoglobin V7Q  Result Value Ref Range   Hgb A1c MFr Bld 5.6 4.6 - 6.5 %  Lipid panel  Result Value Ref Range   Cholesterol 190 0 - 200 mg/dL   Triglycerides 46.9 0.0 - 149.0 mg/dL   HDL 62.95 >28.41 mg/dL   VLDL 32.4 0.0 - 40.1 mg/dL   LDL Cholesterol 91 0 - 99 mg/dL   Total CHOL/HDL Ratio 2    NonHDL 106.45   TSH  Result Value  Ref Range   TSH 3.31 0.35 - 5.50 uIU/mL

## 2021-12-05 ENCOUNTER — Encounter: Payer: Self-pay | Admitting: Family Medicine

## 2021-12-05 ENCOUNTER — Ambulatory Visit (INDEPENDENT_AMBULATORY_CARE_PROVIDER_SITE_OTHER): Payer: BC Managed Care – PPO | Admitting: Family Medicine

## 2021-12-05 VITALS — BP 120/60 | HR 61 | Temp 97.7°F | Ht 62.0 in | Wt 143.8 lb

## 2021-12-05 DIAGNOSIS — Z13220 Encounter for screening for lipoid disorders: Secondary | ICD-10-CM

## 2021-12-05 DIAGNOSIS — Z130 Encounter for screening for diseases of the blood and blood-forming organs and certain disorders involving the immune mechanism: Secondary | ICD-10-CM | POA: Diagnosis not present

## 2021-12-05 DIAGNOSIS — R229 Localized swelling, mass and lump, unspecified: Secondary | ICD-10-CM

## 2021-12-05 DIAGNOSIS — Z131 Encounter for screening for diabetes mellitus: Secondary | ICD-10-CM

## 2021-12-05 DIAGNOSIS — Z1329 Encounter for screening for other suspected endocrine disorder: Secondary | ICD-10-CM | POA: Diagnosis not present

## 2021-12-05 DIAGNOSIS — Z1159 Encounter for screening for other viral diseases: Secondary | ICD-10-CM | POA: Diagnosis not present

## 2021-12-05 MED ORDER — TRIAMCINOLONE ACETONIDE 0.1 % EX CREA: 1.0000 | TOPICAL_CREAM | Freq: Two times a day (BID) | CUTANEOUS | 0 refills | Status: DC

## 2021-12-06 ENCOUNTER — Encounter: Payer: Self-pay | Admitting: Family Medicine

## 2021-12-06 LAB — CBC
HCT: 44.2 % (ref 36.0–46.0)
Hemoglobin: 14.7 g/dL (ref 12.0–15.0)
MCHC: 33.2 g/dL (ref 30.0–36.0)
MCV: 95.9 fl (ref 78.0–100.0)
Platelets: 197 10*3/uL (ref 150.0–400.0)
RBC: 4.61 Mil/uL (ref 3.87–5.11)
RDW: 12.4 % (ref 11.5–15.5)
WBC: 9 10*3/uL (ref 4.0–10.5)

## 2021-12-06 LAB — LIPID PANEL
Cholesterol: 190 mg/dL (ref 0–200)
HDL: 83.7 mg/dL (ref >39.00)
LDL Cholesterol: 91 mg/dL (ref 0–99)
NonHDL: 106.45
Total CHOL/HDL Ratio: 2
Triglycerides: 76 mg/dL (ref 0.0–149.0)
VLDL: 15.2 mg/dL (ref 0.0–40.0)

## 2021-12-06 LAB — COMPREHENSIVE METABOLIC PANEL
ALT: 18 U/L (ref 0–35)
AST: 20 U/L (ref 0–37)
Albumin: 4.8 g/dL (ref 3.5–5.2)
Alkaline Phosphatase: 55 U/L (ref 39–117)
BUN: 18 mg/dL (ref 6–23)
CO2: 29 mEq/L (ref 19–32)
Calcium: 10 mg/dL (ref 8.4–10.5)
Chloride: 99 mEq/L (ref 96–112)
Creatinine, Ser: 0.63 mg/dL (ref 0.40–1.20)
GFR: 113.53 mL/min (ref >60.00)
Glucose, Bld: 87 mg/dL (ref 70–99)
Potassium: 4.1 mEq/L (ref 3.5–5.1)
Sodium: 137 mEq/L (ref 135–145)
Total Bilirubin: 0.6 mg/dL (ref 0.2–1.2)
Total Protein: 7.6 g/dL (ref 6.0–8.3)

## 2021-12-06 LAB — TSH: TSH: 3.31 u[IU]/mL (ref 0.35–5.50)

## 2021-12-06 LAB — HEMOGLOBIN A1C: Hgb A1c MFr Bld: 5.6 % (ref 4.6–6.5)

## 2021-12-06 LAB — HEPATITIS C ANTIBODY: Hepatitis C Ab: NONREACTIVE

## 2021-12-25 ENCOUNTER — Other Ambulatory Visit: Payer: Self-pay | Admitting: Family Medicine

## 2021-12-25 DIAGNOSIS — S29012A Strain of muscle and tendon of back wall of thorax, initial encounter: Secondary | ICD-10-CM

## 2022-01-31 ENCOUNTER — Ambulatory Visit (INDEPENDENT_AMBULATORY_CARE_PROVIDER_SITE_OTHER): Payer: Managed Care, Other (non HMO) | Admitting: Family Medicine

## 2022-01-31 ENCOUNTER — Encounter: Payer: Self-pay | Admitting: Family Medicine

## 2022-01-31 VITALS — BP 102/68 | HR 57 | Temp 98.2°F | Resp 18 | Ht 62.0 in | Wt 127.0 lb

## 2022-01-31 DIAGNOSIS — R058 Other specified cough: Secondary | ICD-10-CM

## 2022-01-31 MED ORDER — HYDROCODONE BIT-HOMATROP MBR 5-1.5 MG/5ML PO SOLN: 5.0000 mL | Freq: Every evening | ORAL | 0 refills | Status: DC | PRN

## 2022-01-31 MED ORDER — BENZONATATE 200 MG PO CAPS: 200.0000 mg | ORAL_CAPSULE | Freq: Two times a day (BID) | ORAL | 0 refills | Status: DC | PRN

## 2022-01-31 NOTE — Progress Notes (Signed)
CC: Cough  Janet Martinez here for URI complaints.  Duration:  2.5  weeks; last couple days coughing got worse Associated symptoms: rhinorrhea and coughing (mainly dry) Denies: sinus congestion, sinus pain, itchy watery eyes, ear pain, ear drainage, sore throat, wheezing, shortness of breath, myalgia, and fevers, N/V/D Treatment to date: Mucinex, Robitussin Sick contacts: No  Past Medical History:  Diagnosis Date   Anxiety    Depression    History of chicken pox    Infection    UTI   Medical history non-contributory     Objective BP 102/68   Pulse (!) 57   Temp 98.2 F (36.8 C)   Resp 18   Ht '5\' 2"'$  (1.575 m)   Wt 127 lb (57.6 kg)   SpO2 94%   BMI 23.23 kg/m  General: Awake, alert, appears stated age HEENT: AT, [place], ears patent b/l and TM's neg, nares patent w/o discharge, pharynx pink and without exudates, MMM Neck: No masses or asymmetry Heart: RRR Lungs: CTAB, no accessory muscle use Psych: Age appropriate judgment and insight, normal mood and affect  Post-viral cough syndrome - Plan: benzonatate (TESSALON) 200 MG capsule, HYDROcodone bit-homatropine (HYCODAN) 5-1.5 MG/5ML syrup  Tessalon Perles during day. Hycodan at night. Could consider ICS. Continue to push fluids, practice good hand hygiene, cover mouth when coughing. F/u prn. If starting to experience fevers, shaking, or shortness of breath, seek immediate care. Pt voiced understanding and agreement to the plan.  [place], DO 01/31/22 5:03 PM

## 2022-01-31 NOTE — Patient Instructions (Addendum)
Do not drink alcohol, do any illicit/street drugs, drive or do anything that requires alertness while on this medicine.   Continue to push fluids, practice good hand hygiene, and cover your mouth if you cough.  If you start having fevers, shaking or shortness of breath, seek immediate care.  Consider an air humidifier.   Honey can help. So can throat lozenges.   Let us know if you need anything.

## 2022-02-01 ENCOUNTER — Encounter: Payer: Self-pay | Admitting: Family Medicine

## 2022-02-01 MED ORDER — FLUTICASONE PROPIONATE HFA 110 MCG/ACT IN AERO: 2.0000 | INHALATION_SPRAY | Freq: Two times a day (BID) | RESPIRATORY_TRACT | 1 refills | Status: DC

## 2022-02-01 MED ORDER — PULMICORT FLEXHALER 90 MCG/ACT IN AEPB: 1.0000 | INHALATION_SPRAY | Freq: Two times a day (BID) | RESPIRATORY_TRACT | 0 refills | Status: DC

## 2022-02-02 ENCOUNTER — Other Ambulatory Visit: Payer: Self-pay | Admitting: Family Medicine

## 2022-02-02 MED ORDER — PREDNISONE 20 MG PO TABS: 40.0000 mg | ORAL_TABLET | Freq: Every day | ORAL | 0 refills | Status: AC

## 2022-02-02 NOTE — Telephone Encounter (Signed)
Error

## 2022-03-08 ENCOUNTER — Encounter: Payer: Self-pay | Admitting: Family Medicine

## 2022-03-08 ENCOUNTER — Telehealth: Payer: Self-pay

## 2022-03-08 MED ORDER — ASMANEX (60 METERED DOSES) 220 MCG/ACT IN AEPB: 1.0000 | INHALATION_SPRAY | Freq: Every day | RESPIRATORY_TRACT | 6 refills | Status: DC

## 2022-03-08 NOTE — Telephone Encounter (Signed)
Flovent HFA on med list but received Pulmicort PA from pharmacy? Pulmicort not covered by insurance. Preferred alternatives;   Advair HFA Not Required Albuterol HFA (Par) Not Required Alvesco Not Required Asmanex HFA Not Required Asmanex Twisthaler 293mg Not Required Budesonide 0.'25mg'$  & 0.'5mg'$  Not Required Budesonide-Formoterol Fumarate Dihydrate Not Required DTuc[facility] Dig[facility] LLC Dba [facility] Digestive InstituteNot Required Fluticasone Propionate HFA Not Required QVAR Redihaler Not Required Spiriva Respimat 1.25 mcg Not Required

## 2022-03-08 NOTE — Telephone Encounter (Signed)
Changed to Asmanex by PCP.

## 2022-06-28 ENCOUNTER — Other Ambulatory Visit: Payer: Self-pay | Admitting: Family Medicine

## 2022-06-28 DIAGNOSIS — F4323 Adjustment disorder with mixed anxiety and depressed mood: Secondary | ICD-10-CM

## 2022-06-28 MED ORDER — SERTRALINE HCL 100 MG PO TABS: 100.0000 mg | ORAL_TABLET | Freq: Every day | ORAL | 1 refills | Status: DC

## 2022-08-09 ENCOUNTER — Encounter: Payer: Self-pay | Admitting: Family Medicine

## 2022-08-09 ENCOUNTER — Telehealth: Payer: Self-pay

## 2022-08-09 ENCOUNTER — Telehealth: Payer: Managed Care, Other (non HMO) | Admitting: Family Medicine

## 2022-08-09 DIAGNOSIS — G43109 Migraine with aura, not intractable, without status migrainosus: Secondary | ICD-10-CM | POA: Diagnosis not present

## 2022-08-09 MED ORDER — NURTEC 75 MG PO TBDP: 75.0000 mg | ORAL_TABLET | ORAL | 2 refills | Status: DC

## 2022-08-09 NOTE — Telephone Encounter (Signed)
PA initiated via Covermymeds; KEY: [id number]. Awaiting determination.

## 2022-08-09 NOTE — Telephone Encounter (Signed)
PA approved.   [id number];Review Type:Prior Auth;Coverage Start Date:08/09/2022;Coverage End Date:08/09/2023;

## 2022-08-09 NOTE — Progress Notes (Signed)
[facility] at [facility] [address] [place], [place] [address] [phone]  Date:  08/09/2022   Name:  Janet Martinez   DOB:  Oct 28, 1985   MRN:  [id number]  PCP:  Pearline Cables, MD    Chief Complaint: No chief complaint on file.   History of Present Illness:  Janet Martinez is a 37 y.o. very pleasant female patient who presents with the following:  Virtual visit today to discuss migraine HA Pt location is home, my location is office Connected with patient via video monitor.  Patient identity confirmed with 2 factors, she gives consent for virtual visit today.  The patient myself are present on the visit today  Pt notes long history of migraine headaches, but getting worse the last few months since the weather warmed up- she has noted this seasonal pattern in the past but seems esp bad this year  She may get two severe migraines a week She went to [place] last week and the weather was cooler and she did much better-no headaches at all She does not feel like she has allergy sx in particular When she will get a HA she may use ibuprofen-this may help, but she has debilitating aura and can be out of commission for 1 to 2 days with each headache She will get visual aura and may sometimes have difficulty speaking-she states this is a long-term pattern and not new She does not have vomiting-she only had migraine with vomiting when she was pregnant  She states there is no chance of current pregnancy  She has used triptan medication in the past but notes it did not really seem to help her  We discussed trying a CGRP drug and she is interested .  We decided to go with an oral option at first, if this is helpful she might look into doing injections later.  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   Abnormal fetal ultrasound    Nonallopathic lesion-rib cage 12/02/2013   Nonallopathic lesion of cervical region 03/17/2013   Nonallopathic lesion of lumbosacral region 03/17/2013   Nonallopathic lesion of thoracic region 03/17/2013   SVD (spontaneous vaginal delivery) 02/08/2011    Past Medical History:  Diagnosis Date   Anxiety    Depression    History of chicken pox    Infection    UTI   Medical history non-contributory     Past Surgical History:  Procedure Laterality Date   NO PAST SURGERIES     WISDOM TOOTH EXTRACTION      Social History   Tobacco Use   Smoking status: Never   Smokeless tobacco: Never  Vaping Use   Vaping Use: Never used  Substance Use Topics   Alcohol use: Not Currently   Drug use: No    Family History  Problem Relation Age of Onset   Mental illness Sister        anxiety   Seizures Brother    Cancer Paternal Grandmother  ovarian   Miscarriages / [place] Mother    Cancer Paternal Aunt        multiple types   Breast cancer Other     No Known Allergies  Medication list has been reviewed and updated.  Current Outpatient Medications on File Prior to Visit  Medication Sig Dispense Refill   benzonatate (TESSALON) 200 MG capsule Take 1 capsule (200 mg total) by mouth 2 (two) times daily as needed for cough. 20 capsule 0   HYDROcodone bit-homatropine (HYCODAN) 5-1.5 MG/5ML syrup Take 5 mLs by mouth at bedtime as needed for cough. 120 mL 0   meloxicam (MOBIC) 15 MG tablet Take 1 tablet (15 mg total) by mouth daily. Use as needed for pain 30 tablet 2   mometasone (ASMANEX, 60 METERED DOSES,) 220 MCG/ACT inhaler Inhale 1-2  puffs into the lungs daily. 1 each 6   sertraline (ZOLOFT) 100 MG tablet Take 1 tablet (100 mg total) by mouth daily. 90 tablet 1   triamcinolone cream (KENALOG) 0.1 % Apply 1 Application topically 2 (two) times daily. 30 g 0   No current facility-administered medications on file prior to visit.    Review of Systems:  As per HPI- otherwise negative.   Physical Examination: There were no vitals filed for this visit. There were no vitals filed for this visit. There is no height or weight on file to calculate BMI. Ideal Body Weight:    Patient is observed via video monitor.  She looks well, her normal self.  No facial drooping or asymmetry is noted  Assessment and Plan: Migraine with aura and without status migrainosus, not intractable - Plan: Rimegepant Sulfate (NURTEC) 75 MG TBDP, Ambulatory referral to Neurology Virtual visit today to discuss migraine with aura.  Janet Martinez has been bothered by headaches for years, but they seem to be developing a pattern of getting worse during the warmer months.  She has tried a triptan previously without much success and would like to try something different.  Will have her try a CGRP, we will start with every other day Nurtec.  I have asked her to let me know how this works for her, and also let me know if she is not able to get it filled for any reason.  We may need to try an alternative drug in the class if this is not covered.  Will also place a referral to neurology to discuss other options in case this is not helpful-or if she would like to change to an injectable option later on  Signed Abbe Amsterdam, MD

## 2022-11-03 NOTE — Patient Instructions (Incomplete)
It was good to see you again!   Recommend COVID booster, flu shot this fall

## 2022-11-03 NOTE — Progress Notes (Unsigned)
[facility] at [facility] [address], [address] [place], [place] [address] [phone]  Date:  11/08/2022   Name:  Janet Martinez   DOB:  09-28-1985   MRN:  [id number]  PCP:  Pearline Cables, MD    Chief Complaint: No chief complaint on file.   History of Present Illness:  Janet Martinez is a 37 y.o. very pleasant female patient who presents with the following:  Patient seen today for medication follow-up Most recent visit with myself was a virtual visit in June-that time she was having a lot of difficulty with migraine headache.  We decided to try Nurtec  She is also using Asmanex, sertraline 100  Pap smear Flu shot COVID booster  Patient Active Problem List   Diagnosis Date Noted   Encounter for planned induction of labor 03/27/2019   Postpartum care following vaginal delivery 2/4 03/27/2019   Perineal laceration, second degree 03/27/2019   Neck pain 05/31/2017   Migraine 04/04/2016   Hydrops fetalis    Hydrops fetalis in second trimester, antepartum    Fetal cystic hygroma 01/14/2014   Abnormal fetal ultrasound    Nonallopathic lesion-rib cage 12/02/2013   Nonallopathic lesion of cervical region 03/17/2013   Nonallopathic lesion of lumbosacral region 03/17/2013   Nonallopathic lesion of thoracic region 03/17/2013   SVD (spontaneous vaginal delivery) 02/08/2011    Past Medical History:  Diagnosis Date   Anxiety    Depression    History of chicken pox    Infection    UTI   Medical history non-contributory     Past Surgical History:  Procedure Laterality Date   NO PAST SURGERIES     WISDOM TOOTH EXTRACTION      Social History   Tobacco Use   Smoking status: Never   Smokeless tobacco: Never  Vaping Use   Vaping status: Never Used  Substance Use Topics   Alcohol use: Not Currently   Drug use: No    Family History  Problem Relation Age of Onset   Mental illness Sister        anxiety   Seizures Brother     Cancer Paternal Grandmother        ovarian   Miscarriages / [place] Mother    Cancer Paternal Aunt        multiple types   Breast cancer Other     No Known Allergies  Medication list has been reviewed and updated.  Current Outpatient Medications on File Prior to Visit  Medication Sig Dispense Refill   benzonatate (TESSALON) 200 MG capsule Take 1 capsule (200 mg total) by mouth 2 (two) times daily as needed for cough. 20 capsule 0   HYDROcodone bit-homatropine (HYCODAN) 5-1.5 MG/5ML syrup Take 5 mLs by mouth at bedtime as needed for cough. 120 mL 0   meloxicam (MOBIC) 15 MG tablet Take 1 tablet (15 mg total) by mouth daily. Use as needed for pain 30 tablet 2   mometasone (ASMANEX, 60 METERED DOSES,) 220 MCG/ACT inhaler Inhale 1-2 puffs into the lungs daily. 1 each 6   Rimegepant Sulfate (NURTEC) 75 MG TBDP Take 1 tablet (75 mg total) by mouth every other day. 15 tablet 2   sertraline (ZOLOFT) 100 MG tablet Take 1 tablet (100 mg total) by mouth daily. 90 tablet 1   triamcinolone cream (KENALOG) 0.1 % Apply 1 Application topically 2 (two) times daily. 30 g 0   No current facility-administered medications on file prior  to visit.    Review of Systems:  As per HPI- otherwise negative.   Physical Examination: There were no vitals filed for this visit. There were no vitals filed for this visit. There is no height or weight on file to calculate BMI. Ideal Body Weight:    GEN: no acute distress. HEENT: Atraumatic, Normocephalic.  Ears and Nose: No external deformity. CV: RRR, No M/G/R. No JVD. No thrill. No extra heart sounds. PULM: CTA B, no wheezes, crackles, rhonchi. No retractions. No resp. distress. No accessory muscle use. ABD: S, NT, ND, +BS. No rebound. No HSM. EXTR: No c/c/e PSYCH: Normally interactive. Conversant.    Assessment and Plan: ***  Signed Abbe Amsterdam, MD

## 2022-11-08 ENCOUNTER — Ambulatory Visit: Payer: Managed Care, Other (non HMO) | Admitting: Family Medicine

## 2022-11-08 ENCOUNTER — Other Ambulatory Visit (HOSPITAL_COMMUNITY)
Admission: RE | Admit: 2022-11-08 | Discharge: 2022-11-08 | Disposition: A | Discharge status: Home / Self Care | Payer: Managed Care, Other (non HMO) | Source: Ambulatory Visit | Attending: Family Medicine | Admitting: Family Medicine

## 2022-11-08 ENCOUNTER — Encounter: Payer: Self-pay | Admitting: Family Medicine

## 2022-11-08 VITALS — BP 122/80 | HR 56 | Temp 97.9°F | Resp 18 | Ht 62.0 in | Wt 133.4 lb

## 2022-11-08 DIAGNOSIS — G43109 Migraine with aura, not intractable, without status migrainosus: Secondary | ICD-10-CM | POA: Diagnosis not present

## 2022-11-08 DIAGNOSIS — R5383 Other fatigue: Secondary | ICD-10-CM | POA: Diagnosis not present

## 2022-11-08 DIAGNOSIS — Z124 Encounter for screening for malignant neoplasm of cervix: Secondary | ICD-10-CM | POA: Insufficient documentation

## 2022-11-08 DIAGNOSIS — F4323 Adjustment disorder with mixed anxiety and depressed mood: Secondary | ICD-10-CM

## 2022-11-08 DIAGNOSIS — Z13220 Encounter for screening for lipoid disorders: Secondary | ICD-10-CM | POA: Diagnosis not present

## 2022-11-08 DIAGNOSIS — Z131 Encounter for screening for diabetes mellitus: Secondary | ICD-10-CM | POA: Diagnosis not present

## 2022-11-08 DIAGNOSIS — Z1329 Encounter for screening for other suspected endocrine disorder: Secondary | ICD-10-CM

## 2022-11-08 DIAGNOSIS — Z130 Encounter for screening for diseases of the blood and blood-forming organs and certain disorders involving the immune mechanism: Secondary | ICD-10-CM

## 2022-11-08 DIAGNOSIS — Z23 Encounter for immunization: Secondary | ICD-10-CM

## 2022-11-08 LAB — COMPREHENSIVE METABOLIC PANEL WITH GFR
ALT: 19 U/L (ref 0–35)
AST: 20 U/L (ref 0–37)
Albumin: 4.5 g/dL (ref 3.5–5.2)
Alkaline Phosphatase: 50 U/L (ref 39–117)
BUN: 20 mg/dL (ref 6–23)
CO2: 27 meq/L (ref 19–32)
Calcium: 9.3 mg/dL (ref 8.4–10.5)
Chloride: 103 meq/L (ref 96–112)
Creatinine, Ser: 0.79 mg/dL (ref 0.40–1.20)
GFR: 95.12 mL/min (ref >60.00)
Glucose, Bld: 91 mg/dL (ref 70–99)
Potassium: 4.1 meq/L (ref 3.5–5.1)
Sodium: 138 meq/L (ref 135–145)
Total Bilirubin: 0.5 mg/dL (ref 0.2–1.2)
Total Protein: 6.8 g/dL (ref 6.0–8.3)

## 2022-11-08 LAB — LIPID PANEL
Cholesterol: 164 mg/dL (ref 0–200)
HDL: 75.9 mg/dL (ref >39.00)
LDL Cholesterol: 71 mg/dL (ref 0–99)
NonHDL: 88.39
Total CHOL/HDL Ratio: 2
Triglycerides: 89 mg/dL (ref 0.0–149.0)
VLDL: 17.8 mg/dL (ref 0.0–40.0)

## 2022-11-08 LAB — CBC
HCT: 42.4 % (ref 36.0–46.0)
Hemoglobin: 14 g/dL (ref 12.0–15.0)
MCHC: 33.1 g/dL (ref 30.0–36.0)
MCV: 96.2 fl (ref 78.0–100.0)
Platelets: 182 10*3/uL (ref 150.0–400.0)
RBC: 4.4 Mil/uL (ref 3.87–5.11)
RDW: 12.5 % (ref 11.5–15.5)
WBC: 6.1 10*3/uL (ref 4.0–10.5)

## 2022-11-08 LAB — HEMOGLOBIN A1C: Hgb A1c MFr Bld: 5.3 % (ref 4.6–6.5)

## 2022-11-08 LAB — VITAMIN D 25 HYDROXY (VIT D DEFICIENCY, FRACTURES): VITD: 32.31 ng/mL (ref 30.00–100.00)

## 2022-11-08 LAB — TSH: TSH: 5.45 u[IU]/mL (ref 0.35–5.50)

## 2022-11-09 ENCOUNTER — Encounter: Payer: Self-pay | Admitting: Neurology

## 2022-11-09 ENCOUNTER — Ambulatory Visit (INDEPENDENT_AMBULATORY_CARE_PROVIDER_SITE_OTHER): Payer: Managed Care, Other (non HMO) | Admitting: Neurology

## 2022-11-09 VITALS — BP 110/72 | HR 68 | Ht 62.0 in | Wt 132.6 lb

## 2022-11-09 DIAGNOSIS — H539 Unspecified visual disturbance: Secondary | ICD-10-CM | POA: Diagnosis not present

## 2022-11-09 DIAGNOSIS — G43709 Chronic migraine without aura, not intractable, without status migrainosus: Secondary | ICD-10-CM

## 2022-11-09 DIAGNOSIS — R4701 Aphasia: Secondary | ICD-10-CM

## 2022-11-09 DIAGNOSIS — G43109 Migraine with aura, not intractable, without status migrainosus: Secondary | ICD-10-CM

## 2022-11-09 DIAGNOSIS — R442 Other hallucinations: Secondary | ICD-10-CM | POA: Diagnosis not present

## 2022-11-09 MED ORDER — NURTEC 75 MG PO TBDP
75.0000 mg | ORAL_TABLET | Freq: Every day | ORAL | Status: DC | PRN
Start: 2022-11-09 — End: 2022-12-12

## 2022-11-09 MED ORDER — AJOVY 225 MG/1.5ML ~~LOC~~ SOAJ
225.0000 mg | SUBCUTANEOUS | Status: DC
Start: 2022-11-09 — End: 2022-12-12

## 2022-11-09 NOTE — Progress Notes (Signed)
[facility]    Provider:  Dr Lucia Gaskins Requesting Provider: Patsy Lager, Gwenlyn Found, MD Primary Care Provider:  Pearline Cables, MD  CC:  migraines  HPI:  Janet Martinez is a 37 y.o. female here as requested by Copland, Gwenlyn Found, MD for migraines. has SVD (spontaneous vaginal delivery); Nonallopathic lesion of cervical region; Nonallopathic lesion of lumbosacral region; Nonallopathic lesion of thoracic region; Nonallopathic lesion-rib cage; Abnormal fetal ultrasound; Fetal cystic hygroma; Hydrops fetalis in second trimester, antepartum; Hydrops fetalis; Migraine; Neck pain; and Encounter for planned induction of labor on their problem list. The summer she improved and migraines went away. They are back not as bad as they were in the spring every few days. A lot of times she can;t talk it happens normally before it starts and she cannot form words she physically cannot form words. Mother has migraines and has spots now she does too. Things get she was on the playground everything felt like they were so much bigger and louder than normal(aiws?). She also has many headaches that can turn into migraines. Started in college. Husband had vasectomy and not had any relations not pregnant. She prefers botox but has to try injection she ids amenable,.Migraines are pulsating/pounding/throbbing, some with aura, with nausea as well as photo/phonophobia hurts to move helps to be in a quiet dark room. Stress is a trigger makes them worse and increased frequency. Has > 8 migraine days a month and > 15 total headache days a month both can be mod to severe and last all day 24 hours > 6 months. No other focal neurologic deficits, associated symptoms, inciting events or modifiable factors.    Reviewed notes, labs and imaging from outside physicians, which showed:  Medications taken that can be used in migraine management or headache management from a thorough review of records and patient self-reports  include: Tylenol and various over-the-counter analgesics, Zoloft, Stadol, Flexeril, Benadryl, gabapentin, hydrocodone, hydroxyzine, ibuprofen, ketorolac, Mobic, magnesium, Zofran oxycodone, prednisone, Phenergan, Nurtec, Maxalt, Imitrex, tizanidine, tramadol, amitriptyline c/I due to risk of seratonin syndrome, BP c/I due to hypotension, topiramate  MRI cervical spine 2015:  Narrative  CLINICAL DATA:  Right-sided neck pain  EXAM: MRI CERVICAL SPINE WITHOUT CONTRAST  TECHNIQUE: Multiplanar, multisequence MR imaging was performed. No intravenous contrast was administered.  COMPARISON:  None.  FINDINGS: The cervical cord is normal in size and signal. Vertebral body heights are maintained. The disc spaces are preserved. The cervical spine is normal in lordotic alignment. No static listhesis. Bone marrow signal is normal. Cerebellar tonsils are normal in position.  C2-3: No significant disc bulge. No neural foraminal stenosis. No central canal stenosis.  C3-4: Minimal broad-based disc bulge. No neural foraminal stenosis. No central canal stenosis.  C4-5: Mild broad-based disc bulge. No neural foraminal stenosis. No central canal stenosis.  C5-6: Minimal broad-based disc bulge. No neural foraminal stenosis. No central canal stenosis.  C6-7: Mild broad-based disc bulge with a right posterior annular tear. No neural foraminal stenosis. No central canal stenosis.  C7-T1 No significant disc bulge. No neural foraminal stenosis. No central canal stenosis.  IMPRESSION: Mild cervical spondylosis as described above.    Recent Results (from the past 2160 hour(s))  CBC     Status: None   Collection Time: 11/08/22  9:44 AM  Result Value Ref Range   WBC 6.1 4.0 - 10.5 K/uL   RBC 4.40 3.87 - 5.11 Mil/uL   Platelets 182.0 150.0 - 400.0 K/uL   Hemoglobin 14.0 12.0 - 15.0 g/dL  HCT 42.4 36.0 - 46.0 %   MCV 96.2 78.0 - 100.0 fl   MCHC 33.1 30.0 - 36.0 g/dL   RDW 66.4 40.3 - 47.4 %   Comprehensive metabolic panel     Status: None   Collection Time: 11/08/22  9:44 AM  Result Value Ref Range   Sodium 138 135 - 145 mEq/L   Potassium 4.1 3.5 - 5.1 mEq/L   Chloride 103 96 - 112 mEq/L   CO2 27 19 - 32 mEq/L   Glucose, Bld 91 70 - 99 mg/dL   BUN 20 6 - 23 mg/dL   Creatinine, Ser 2.59 0.40 - 1.20 mg/dL   Total Bilirubin 0.5 0.2 - 1.2 mg/dL   Alkaline Phosphatase 50 39 - 117 U/L   AST 20 0 - 37 U/L   ALT 19 0 - 35 U/L   Total Protein 6.8 6.0 - 8.3 g/dL   Albumin 4.5 3.5 - 5.2 g/dL   GFR 56.38 >75.64 mL/min    Comment: Calculated using the CKD-EPI Creatinine Equation (2021)   Calcium 9.3 8.4 - 10.5 mg/dL  Hemoglobin P3I     Status: None   Collection Time: 11/08/22  9:44 AM  Result Value Ref Range   Hgb A1c MFr Bld 5.3 4.6 - 6.5 %    Comment: Glycemic Control Guidelines for People with Diabetes:Non Diabetic:  <6%Goal of Therapy: <7%Additional Action Suggested:  >8%   Lipid panel     Status: None   Collection Time: 11/08/22  9:44 AM  Result Value Ref Range   Cholesterol 164 0 - 200 mg/dL    Comment: ATP III Classification       Desirable:  < 200 mg/dL               Borderline High:  200 - 239 mg/dL          High:  > = 951 mg/dL   Triglycerides 88.4 0.0 - 149.0 mg/dL    Comment: Normal:  <166 mg/dLBorderline High:  150 - 199 mg/dL   HDL 06.30 >16.01 mg/dL   VLDL 09.3 0.0 - 23.5 mg/dL   LDL Cholesterol 71 0 - 99 mg/dL   Total CHOL/HDL Ratio 2     Comment:                Men          Women1/2 Average Risk     3.4          3.3Average Risk          5.0          4.42X Average Risk          9.6          7.13X Average Risk          15.0          11.0                       NonHDL 88.39     Comment: NOTE:  Non-HDL goal should be 30 mg/dL higher than patient's LDL goal (i.e. LDL goal of < 70 mg/dL, would have non-HDL goal of < 100 mg/dL)  TSH     Status: None   Collection Time: 11/08/22  9:44 AM  Result Value Ref Range   TSH 5.45 0.35 - 5.50 uIU/mL  VITAMIN D 25 Hydroxy  (Vit-D Deficiency, Fractures)     Status: None   Collection Time: 11/08/22  9:44 AM  Result Value Ref Range   VITD 32.31 30.00 - 100.00 ng/mL     Review of Systems: Patient complains of symptoms per HPI as well as the following symptoms none. Pertinent negatives and positives per HPI. All others negative.   Social History   Socioeconomic History   Marital status: Married    Spouse name: Not on file   Number of children: Not on file   Years of education: Not on file   Highest education level: Not on file  Occupational History   Not on file  Tobacco Use   Smoking status: Never   Smokeless tobacco: Never  Vaping Use   Vaping status: Never Used  Substance and Sexual Activity   Alcohol use: Yes    Alcohol/week: 14.0 standard drinks of alcohol    Types: 14 Glasses of wine per week   Drug use: No   Sexual activity: Yes  Other Topics Concern   Not on file  Social History Narrative   ** Merged History Encounter **       ** Data from: 02/08/11 Enc Dept: [facility]       ** Data from: 09/28/11 Enc Dept: [facility]   Married, lives with spouse and 1 dtr, working at [employer]   Social Determinants of Corporate investment banker Strain: Not on file  Food Insecurity: Not on file  Transportation Needs: Not on file  Physical Activity: Not on file  Stress: Not on file  Social Connections: Not on file  Intimate Partner Violence: Not on file    Family History  Problem Relation Age of Onset   Miscarriages / Stillbirths Mother    Migraines Mother    Mental illness Sister        anxiety   Seizures Brother    Cancer Paternal Aunt        multiple types   Cancer Paternal Grandmother        ovarian   Breast cancer Other     Past Medical History:  Diagnosis Date   Anxiety    Depression    History of chicken pox    Infection    UTI   Medical history non-contributory     Patient Active Problem List   Diagnosis Date Noted   Encounter for planned  induction of labor 03/27/2019   Neck pain 05/31/2017   Migraine 04/04/2016   Hydrops fetalis    Hydrops fetalis in second trimester, antepartum    Fetal cystic hygroma 01/14/2014   Abnormal fetal ultrasound    Nonallopathic lesion-rib cage 12/02/2013   Nonallopathic lesion of cervical region 03/17/2013   Nonallopathic lesion of lumbosacral region 03/17/2013   Nonallopathic lesion of thoracic region 03/17/2013   SVD (spontaneous vaginal delivery) 02/08/2011    Past Surgical History:  Procedure Laterality Date   NO PAST SURGERIES     WISDOM TOOTH EXTRACTION      Current Outpatient Medications  Medication Sig Dispense Refill   Ascorbic Acid (VITAMIN C PO) Take by mouth.     B Complex Vitamins (B COMPLEX PO) Take by mouth.     Fremanezumab-vfrm (AJOVY) 225 MG/1.5ML SOAJ Inject 225 mg into the skin every 30 (thirty) days.     MAGNESIUM PO Take by mouth.     Rimegepant Sulfate (NURTEC) 75 MG TBDP Take 1 tablet (75 mg total) by mouth daily as needed. For migraines. Take as close to onset of migraine as possible. One daily maximum.     sertraline (ZOLOFT) 100 MG  tablet Take 1 tablet (100 mg total) by mouth daily. 90 tablet 1   No current facility-administered medications for this visit.    Allergies as of 11/09/2022   (No Known Allergies)    Vitals: BP 110/72   Pulse 68   Ht 5\' 2"  (1.575 m)   Wt 132 lb 9.6 oz (60.1 kg)   BMI 24.25 kg/m  Last Weight:  Wt Readings from Last 1 Encounters:  11/09/22 132 lb 9.6 oz (60.1 kg)   Last Height:   Ht Readings from Last 1 Encounters:  11/09/22 5\' 2"  (1.575 m)     Physical exam: Exam: Gen: NAD, conversant, well nourised, well groomed                     CV: RRR, no MRG. No Carotid Bruits. No peripheral edema, warm, nontender Eyes: Conjunctivae clear without exudates or hemorrhage  Neuro: Detailed Neurologic Exam  Speech:    Speech is normal; fluent and spontaneous with normal comprehension.  Cognition:    The patient is  oriented to person, place, and time;     recent and remote memory intact;     language fluent;     normal attention, concentration,     fund of knowledge Cranial Nerves:    The pupils are equal, round, and reactive to light. The fundi are normal and spontaneous venous pulsations are present. Visual fields are full to finger confrontation. Extraocular movements are intact. Trigeminal sensation is intact and the muscles of mastication are normal. The face is symmetric. The palate elevates in the midline. Hearing intact. Voice is normal. Shoulder shrug is normal. The tongue has normal motion without fasciculations.   Coordination: nml  Gait: nml  Motor Observation:    No asymmetry, no atrophy, and no involuntary movements noted. Tone:    Normal muscle tone.    Posture:    Posture is normal. normal erect    Strength:    Strength is V/V in the upper and lower limbs.      Sensation: intact to LT     Reflex Exam:  DTR's:    Deep tendon reflexes in the upper and lower extremities are normal bilaterally.   Toes:    The toes are downgoing bilaterally.   Clonus:    Clonus is absent.    Assessment/Plan:  She has auras with migraine discussed possibilities and treatments for migraine with aura and risk of stroke  TSh at upper limit normal f/u pcp Discussed MRi of the brain w/wo contrast will order due to concerning symptoms of  Expressive aphasia, Vision changes, Sensory hallucinations, sensory and visual aura,  to look for space occupying mass, chiari or intracranial hypertension (pseudotumor), strokes, malignancies, vasculidities, demyelination(multiple sclerosis), seizure focus or other   New medications: Nurtec or Ubrelvy as needed at onset (Nurtec as needed once a day) AS NEEDED Prevention: Ajovy, Emgality(once monthly injections), Vyepti every 3 month, Botox for migraines AS PREVENTION  Discussed:  [place] Syndrome (AIWS)?   [facility] [email or web address] > health > diseases > 2449... Jan 24, 2021 -- [place] syndrome is a brain-related condition that disrupts how you perceive your own body, the world around you or both. Things to know Causes . What causes [place] syndrome? Based on reported cases, the most common cause in adults is migraines. AIWS is also linked to epilepsy. Genetics may also play a part in developing AIWS   There is increased risk for stroke in  women with migraine with aura and a contraindication for the combined contraceptive pill for use by women who have migraine with aura. The risk for women with migraine without aura is lower. However other risk factors like smoking are far more likely to increase stroke risk than migraine. There is a recommendation for no smoking and for the use of OCPs without estrogen such as progestogen only pills particularly for women with migraine with aura.Marland Kitchen People who have migraine headaches with auras may be 3 times more likely to have a stroke caused by a blood clot, compared to migraine patients who don't see auras. Women who take hormone-replacement therapy may be 30 percent more likely to suffer a clot-based stroke than women not taking medication containing estrogen. Other risk factors like smoking and high blood pressure may be  much more important. And stroke is still a rare complication due to migraine aura and is controversial and lower doses may not cause a risk.  Fremanezumab Injection   Orders Placed This Encounter  Procedures   MR BRAIN W WO CONTRAST   Meds ordered this encounter  Medications   Rimegepant Sulfate (NURTEC) 75 MG TBDP    Sig: Take 1 tablet (75 mg total) by mouth daily as needed. For migraines. Take as close to onset of migraine as possible. One daily maximum.   Fremanezumab-vfrm (AJOVY) 225 MG/1.5ML SOAJ    Sig: Inject 225 mg into the skin every 30 (thirty) days.    Patient has copay card; she can have  medication regardless of insurance approval or copay amount.    Cc: Copland, Gwenlyn Found, MD,  Copland, Gwenlyn Found, MD  Naomie Dean, MD  [facility] [address] [place], [place] [address]  Phone [phone] Fax [phone]

## 2022-11-09 NOTE — Patient Instructions (Addendum)
Discussed MRi of the brain w/wo contrast will order New medications: Nurtec or Ubrelvy as needed at onset (Nurtec as needed once a day) AS NEEDED Prevention: Ajovy, Emgality(once monthly injections), Vyepti every 3 month, Botox for migraines AS PREVENTION  [place] Syndrome (AIWS)  [facility] [email or web address] > health > diseases > 2449... Jan 24, 2021 -- [place] in [place] syndrome is a brain-related condition that disrupts how you perceive your own body, the world around you or both. Things to know Causes . What causes [place] syndrome? Based on reported cases, the most common cause in adults is migraines. AIWS is also linked to epilepsy. Genetics may also play a part in developing AIWS   There is increased risk for stroke in women with migraine with aura and a contraindication for the combined contraceptive pill for use by women who have migraine with aura. The risk for women with migraine without aura is lower. However other risk factors like smoking are far more likely to increase stroke risk than migraine. There is a recommendation for no smoking and for the use of OCPs without estrogen such as progestogen only pills particularly for women with migraine with aura.Marland Kitchen People who have migraine headaches with auras may be 3 times more likely to have a stroke caused by a blood clot, compared to migraine patients who don't see auras. Women who take hormone-replacement therapy may be 30 percent more likely to suffer a clot-based stroke than women not taking medication containing estrogen. Other risk factors like smoking and high blood pressure may be  much more important. And stroke is still a rare complication due to migraine aura and is controversial and lower doses may not cause a risk.  Fremanezumab Injection What is this medication? FREMANEZUMAB (fre ma NEZ ue mab) prevents migraines. It works by blocking a substance in the body that causes migraines.  It is a monoclonal antibody. This medicine may be used for other purposes; ask your health care provider or pharmacist if you have questions. COMMON BRAND NAME(S): AJOVY What should I tell my care team before I take this medication? They need to know if you have any of these conditions: An unusual or allergic reaction to fremanezumab, other medications, foods, dyes, or preservatives Pregnant or trying to get pregnant Breast-feeding How should I use this medication? This medication is injected under the skin. You will be taught how to prepare and give it. Take it as directed on the prescription label. Keep taking it unless your care team tells you to stop. It is important that you put your used needles and syringes in a special sharps container. Do not put them in a trash can. If you do not have a sharps container, call your pharmacist or care team to get one. Talk to your care team about the use of this medication in children. Special care may be needed. Overdosage: If you think you have taken too much of this medicine contact a poison control center or emergency room at once. NOTE: This medicine is only for you. Do not share this medicine with others. What if I miss a dose? If you miss a dose, take it as soon as you can. If it is almost time for your next dose, take only that dose. Do not take double or extra doses. What may interact with this medication? Interactions are not expected. This list may not describe all possible interactions. Give your health care provider a list of all the medicines, herbs, non-prescription drugs,  or dietary supplements you use. Also tell them if you smoke, drink alcohol, or use illegal drugs. Some items may interact with your medicine. What should I watch for while using this medication? Tell your care team if your symptoms do not start to get better or if they get worse. What side effects may I notice from receiving this medication? Side effects that you should  report to your care team as soon as possible: Allergic reactions or angioedema--skin rash, itching or hives, swelling of the face, eyes, lips, tongue, arms, or legs, trouble swallowing or breathing Side effects that usually do not require medical attention (report to your care team if they continue or are bothersome): Pain, redness, or irritation at injection site This list may not describe all possible side effects. Call your doctor for medical advice about side effects. You may report side effects to FDA at 1-800-FDA-1088. Where should I keep my medication? Keep out of the reach of children and pets. Store in a refrigerator or at room temperature between 20 and 25 degrees C (68 and 77 degrees F). Refrigeration (preferred): Store in the refrigerator. Do not freeze. Keep in the original container until you are ready to take it. Remove the dose from the carton about 30 minutes before it is time for you to use it. If the dose is not used, it may be stored in the original container at room temperature for 7 days. Get rid of any unused medication after the expiration date. Room Temperature: This medication may be stored at room temperature for up to 7 days. Keep it in the original container. Protect from light until time of use. If it is stored at room temperature, get rid of any unused medication after 7 days or after it expires, whichever is first. To get rid of medications that are no longer needed or have expired: Take the medication to a medication take-back program. Check with your pharmacy or law enforcement to find a location. If you cannot return the medication, ask your pharmacist or care team how to get rid of this medication safely. NOTE: This sheet is a summary. It may not cover all possible information. If you have questions about this medicine, talk to your doctor, pharmacist, or health care provider.  2024 Elsevier/Gold Standard (2021-04-01 00:00:00) Rimegepant Disintegrating  Tablets What is this medication? RIMEGEPANT (ri ME je pant) prevents and treats migraines. It works by blocking a substance in the body that causes migraines. This medicine may be used for other purposes; ask your health care provider or pharmacist if you have questions. COMMON BRAND NAME(S): NURTEC ODT What should I tell my care team before I take this medication? They need to know if you have any of these conditions: Kidney disease Liver disease An unusual or allergic reaction to rimegepant, other medications, foods, dyes, or preservatives Pregnant or trying to get pregnant Breast-feeding How should I use this medication? Take this medication by mouth. Take it as directed on the prescription label. Leave the tablet in the sealed pack until you are ready to take it. With dry hands, open the pack and gently remove the tablet. If the tablet breaks or crumbles, throw it away. Use a new tablet. Place the tablet in the mouth and allow it to dissolve. Then, swallow it. Do not cut, crush, or chew this medication. You do not need water to take this medication. Talk to your care team about the use of this medication in children. Special care may be needed. Overdosage:  If you think you have taken too much of this medicine contact a poison control center or emergency room at once. NOTE: This medicine is only for you. Do not share this medicine with others. What if I miss a dose? This does not apply. This medication is not for regular use. What may interact with this medication? Certain medications for fungal infections, such as fluconazole, itraconazole Rifampin This list may not describe all possible interactions. Give your health care provider a list of all the medicines, herbs, non-prescription drugs, or dietary supplements you use. Also tell them if you smoke, drink alcohol, or use illegal drugs. Some items may interact with your medicine. What should I watch for while using this medication? Visit  your care team for regular checks on your progress. Tell your care team if your symptoms do not start to get better or if they get worse. What side effects may I notice from receiving this medication? Side effects that you should report to your care team as soon as possible: Allergic reactions--skin rash, itching, hives, swelling of the face, lips, tongue, or throat Side effects that usually do not require medical attention (report to your care team if they continue or are bothersome): Nausea Stomach pain This list may not describe all possible side effects. Call your doctor for medical advice about side effects. You may report side effects to FDA at 1-800-FDA-1088. Where should I keep my medication? Keep out of the reach of children and pets. Store at room temperature between 20 and 25 degrees C (68 and 77 degrees F). Get rid of any unused medication after the expiration date. To get rid of medications that are no longer needed or have expired: Take the medication to a medication take-back program. Check with your pharmacy or law enforcement to find a location. If you cannot return the medication, check the label or package insert to see if the medication should be thrown out in the garbage or flushed down the toilet. If you are not sure, ask your care team. If it is safe to put it in the trash, take the medication out of the container. Mix the medication with cat litter, dirt, coffee grounds, or other unwanted substance. Seal the mixture in a bag or container. Put it in the trash. NOTE: This sheet is a summary. It may not cover all possible information. If you have questions about this medicine, talk to your doctor, pharmacist, or health care provider.  2024 Elsevier/Gold Standard (2021-03-30 00:00:00)  Magnesium doses depending on which one you take(pick one) and other OTC medications you can try: 200 mg mag citrate 1-2x daily OR 400 mg mag oxide 1-2x daily OR Mag sulfate 400 to 600 mg  daily  Other optional vitamin supplementation that may work includes 1000 international units AND vitamin D3 daily AND 500 to 1000 mg of B12 daily AND Riboflavin 400 mg a day AND 150 to 300 mg co-Q10 daily

## 2022-11-12 ENCOUNTER — Encounter: Payer: Self-pay | Admitting: Neurology

## 2022-11-13 ENCOUNTER — Telehealth: Payer: Self-pay | Admitting: Neurology

## 2022-11-13 NOTE — Telephone Encounter (Signed)
sent to GI they obtain Janet Martinez: [id number] exp. 11/13/22-01/11/23 [phone]

## 2022-11-14 LAB — CYTOLOGY - PAP
Comment: NEGATIVE
Diagnosis: NEGATIVE
Diagnosis: REACTIVE
High risk HPV: NEGATIVE

## 2022-11-15 ENCOUNTER — Encounter: Payer: Self-pay | Admitting: Family Medicine

## 2022-11-21 ENCOUNTER — Encounter: Payer: Self-pay | Admitting: Family Medicine

## 2022-12-11 ENCOUNTER — Encounter: Payer: Self-pay | Admitting: Family Medicine

## 2022-12-11 ENCOUNTER — Other Ambulatory Visit: Payer: Self-pay | Admitting: Family Medicine

## 2022-12-11 ENCOUNTER — Encounter: Payer: Self-pay | Admitting: Neurology

## 2022-12-11 DIAGNOSIS — G43709 Chronic migraine without aura, not intractable, without status migrainosus: Secondary | ICD-10-CM

## 2022-12-11 MED ORDER — SERTRALINE HCL 100 MG PO TABS: 100.0000 mg | ORAL_TABLET | Freq: Every day | ORAL | 3 refills | Status: DC

## 2022-12-11 MED ORDER — SERTRALINE HCL 100 MG PO TABS: 200.0000 mg | ORAL_TABLET | Freq: Every day | ORAL | 3 refills | Status: DC

## 2022-12-12 MED ORDER — NURTEC 75 MG PO TBDP: 75.0000 mg | ORAL_TABLET | Freq: Every day | ORAL | 11 refills | Status: DC | PRN

## 2022-12-12 MED ORDER — AJOVY 225 MG/1.5ML ~~LOC~~ SOAJ: 225.0000 mg | SUBCUTANEOUS | 11 refills | Status: DC

## 2022-12-13 ENCOUNTER — Encounter: Payer: Self-pay | Admitting: Neurology

## 2022-12-25 ENCOUNTER — Other Ambulatory Visit: Payer: Managed Care, Other (non HMO)

## 2022-12-26 ENCOUNTER — Other Ambulatory Visit (HOSPITAL_COMMUNITY): Payer: Self-pay

## 2022-12-26 ENCOUNTER — Telehealth: Payer: Self-pay

## 2022-12-26 DIAGNOSIS — G43709 Chronic migraine without aura, not intractable, without status migrainosus: Secondary | ICD-10-CM

## 2022-12-26 NOTE — Telephone Encounter (Signed)
Pharmacy Patient Advocate Encounter   Received notification from CoverMyMeds that prior authorization for AJOVY (fremanezumab-vfrm) injection 225MG /1.5ML auto-injectors is required/requested.   Insurance verification completed.   The patient is insured through [employer] .   Per test claim: PA required; PA submitted to above mentioned insurance via CoverMyMeds Key/confirmation #/EOC [id number] Status is pending

## 2022-12-27 ENCOUNTER — Other Ambulatory Visit (HOSPITAL_COMMUNITY): Payer: Self-pay

## 2022-12-27 NOTE — Telephone Encounter (Signed)
Pharmacy Patient Advocate Encounter  Received notification from [employer] that Prior Authorization for AJOVY (fremanezumab-vfrm) injection 225MG /1.5ML auto-injectors has been APPROVED from 12/26/2022 to 12/26/2023. Ran test claim, Copay is $24.98. This test claim was processed through [facility]- copay amounts may vary at other pharmacies due to pharmacy/plan contracts, or as the patient moves through the different stages of their insurance plan.   PA #/Case ID/Reference #: PA Case ID #: [id number]

## 2022-12-28 MED ORDER — NURTEC 75 MG PO TBDP: 75.0000 mg | ORAL_TABLET | Freq: Every day | ORAL | 11 refills | Status: DC | PRN

## 2022-12-28 NOTE — Addendum Note (Signed)
Addended by: Bertram Savin on: 12/28/2022 03:49 PM   Modules accepted: Orders

## 2023-03-01 ENCOUNTER — Encounter: Payer: Self-pay | Admitting: Family

## 2023-03-01 ENCOUNTER — Ambulatory Visit (INDEPENDENT_AMBULATORY_CARE_PROVIDER_SITE_OTHER): Payer: Managed Care, Other (non HMO) | Admitting: Family

## 2023-03-01 VITALS — BP 105/67 | HR 97 | Temp 100.6°F | Wt 133.1 lb

## 2023-03-01 DIAGNOSIS — J020 Streptococcal pharyngitis: Secondary | ICD-10-CM

## 2023-03-01 LAB — POCT INFLUENZA A/B
Influenza A, POC: NEGATIVE
Influenza B, POC: NEGATIVE

## 2023-03-01 LAB — POC COVID19 BINAXNOW: SARS Coronavirus 2 Ag: NEGATIVE

## 2023-03-01 LAB — POCT RAPID STREP A (OFFICE): Rapid Strep A Screen: POSITIVE — AB

## 2023-03-01 MED ORDER — AMOXICILLIN 500 MG PO CAPS: 500.0000 mg | ORAL_CAPSULE | Freq: Two times a day (BID) | ORAL | 0 refills | Status: AC

## 2023-03-01 NOTE — Progress Notes (Signed)
 Patient ID: Janet Martinez, female    DOB: [birth year], 38 y.o.   MRN: [id number]  Chief Complaint  Patient presents with   Sinus Problem    Pt c/o sore throat, body aches, fever of 103.0 , slight nasal congestion, present for 1 day.  Has tried nyquil which did not help sx.        Discussed the use of AI scribe software for clinical note transcription with the patient, who gave verbal consent to proceed.  History of Present Illness   The patient presents with a one-day history of severe throat pain and fever. She reports feeling unwell and 'achy all over.' She denies cough, ear pain, and just mild sinus congestion. She has not noticed any white patches in her throat. She has not recently been on antibiotics and has a history of strep throat when she was younger. She has no contraindications to ibuprofen  or Aleve.     Assessment & Plan:     Streptococcal Pharyngitis - Acute onset of severe throat pain. Positive rapid strep test. Negative flu & covid. No cough or ear pain. -Prescribed Amoxicillin  500mg  twice daily for 10 days, advised on use & SE. -Advised on use of Ibuprofen  or Aleve for pain and inflammation. -Suggest OTC Chloraseptic throat spray for numbing and warm salt water gargles for healing. -Advised patient to wear a mask and avoid close contact with immunocompromised individuals for next 24 hours after starting antibiotics.  Mild Sinus Congestion - Mild sinus congestion reported. -Recommend over-the-counter generic Sudafed, Claritin-D, or Zyrtec-D for decongestion.     Subjective:    Outpatient Medications Prior to Visit  Medication Sig Dispense Refill   Ascorbic Acid (VITAMIN C PO) Take by mouth.     B Complex Vitamins (B COMPLEX PO) Take by mouth.     Fremanezumab -vfrm (AJOVY ) 225 MG/1.5ML SOAJ Inject 225 mg into the skin every 30 (thirty) days. 1.5 mL 11   MAGNESIUM PO Take by mouth.     Rimegepant Sulfate (NURTEC) 75 MG TBDP Take 1 tablet (75 mg total) by mouth daily as  needed. For migraines. Take as close to onset of migraine as possible. One daily maximum. 16 tablet 11   sertraline  (ZOLOFT ) 100 MG tablet Take 2 tablets (200 mg total) by mouth daily. 180 tablet 3   No facility-administered medications prior to visit.   Past Medical History:  Diagnosis Date   Anxiety    Depression    History of chicken pox    Infection    UTI   Medical history non-contributory    Past Surgical History:  Procedure Laterality Date   NO PAST SURGERIES     WISDOM TOOTH EXTRACTION     No Known Allergies    Objective:    Physical Exam Vitals and nursing note reviewed.  Constitutional:      Appearance: Normal appearance. She is ill-appearing.     Interventions: Face mask in place.  HENT:     Right Ear: Tympanic membrane and ear canal normal.     Left Ear: Tympanic membrane and ear canal normal.     Nose:     Right Sinus: No frontal sinus tenderness.     Left Sinus: No frontal sinus tenderness.     Mouth/Throat:     Mouth: Mucous membranes are moist.     Pharynx: Posterior oropharyngeal erythema present. No pharyngeal swelling, oropharyngeal exudate or uvula swelling.     Tonsils: Tonsillar exudate and tonsillar abscess (w/perforation in both) present. 2+  on the right. 2+ on the left.  Cardiovascular:     Rate and Rhythm: Normal rate and regular rhythm.  Pulmonary:     Effort: Pulmonary effort is normal.     Breath sounds: Normal breath sounds.  Musculoskeletal:        General: Normal range of motion.  Lymphadenopathy:     Head:     Right side of head: No preauricular or posterior auricular adenopathy.     Left side of head: No preauricular or posterior auricular adenopathy.     Cervical: No cervical adenopathy.  Skin:    General: Skin is warm and dry.  Neurological:     Mental Status: She is alert.  Psychiatric:        Mood and Affect: Mood normal.        Behavior: Behavior normal.    BP 105/67 (BP Location: Left Arm, Patient Position: Sitting,  Cuff Size: Normal)   Pulse 97   Temp (!) 100.6 F (38.1 C) (Temporal)   Wt 133 lb 2 oz (60.4 kg)   LMP 02/14/2023 (Approximate)   SpO2 97%   Breastfeeding Unknown   BMI 24.35 kg/m  Wt Readings from Last 3 Encounters:  03/01/23 133 lb 2 oz (60.4 kg)  11/09/22 132 lb 9.6 oz (60.1 kg)  11/08/22 133 lb 6.4 oz (60.5 kg)      Lucius Krabbe, NP

## 2023-07-02 ENCOUNTER — Encounter: Payer: Self-pay | Admitting: Family Medicine

## 2023-07-03 ENCOUNTER — Telehealth: Payer: Self-pay | Admitting: Adult Health

## 2023-07-03 NOTE — Telephone Encounter (Signed)
 VM box full, sent MyChart msg advising pt of appt change- Jacqlyn Matas out early on 5/27.

## 2023-07-10 ENCOUNTER — Encounter: Payer: Self-pay | Admitting: Neurology

## 2023-07-10 ENCOUNTER — Other Ambulatory Visit: Payer: Self-pay | Admitting: Neurology

## 2023-07-10 DIAGNOSIS — G43709 Chronic migraine without aura, not intractable, without status migrainosus: Secondary | ICD-10-CM

## 2023-07-11 ENCOUNTER — Telehealth: Payer: Self-pay

## 2023-07-11 ENCOUNTER — Other Ambulatory Visit (HOSPITAL_COMMUNITY): Payer: Self-pay

## 2023-07-11 ENCOUNTER — Telehealth: Payer: Self-pay | Admitting: *Deleted

## 2023-07-11 NOTE — Telephone Encounter (Signed)
 Sent to PA team.

## 2023-07-11 NOTE — Telephone Encounter (Signed)
 Authorizations in progress

## 2023-07-11 NOTE — Telephone Encounter (Signed)
 Pharmacy Patient Advocate Encounter   Received notification from Physician's Office that prior authorization for Ajovy  is required/requested.   Insurance verification completed.   The patient is insured through [facility] .   Per test claim: PA required; PA submitted to above mentioned insurance via CoverMyMeds Key/confirmation #/EOC [facility] Status is pending

## 2023-07-11 NOTE — Telephone Encounter (Signed)
 I notified the patient .

## 2023-07-11 NOTE — Telephone Encounter (Signed)
   Maybe PA can be done.

## 2023-07-11 NOTE — Telephone Encounter (Signed)
 Pharmacy Patient Advocate Encounter  Received notification from [facility] that Prior Authorization for AJOVY  (fremanezumab -vfrm) injection 225MG /1.5ML auto-injectors has been APPROVED from 07/11/2023 to 07/10/2024. Ran test claim, Copay is $24.98. This test claim was processed through [facility]- copay amounts may vary at other pharmacies due to pharmacy/plan contracts, or as the patient moves through the different stages of their insurance plan.   PA #/Case ID/Reference #: PA Case ID #: [id number]

## 2023-07-11 NOTE — Telephone Encounter (Signed)
 Pharmacy Patient Advocate Encounter   Received notification from Physician's Office that prior authorization for Nurtec 75MG  dispersible tablets is required/requested.   Insurance verification completed.   The patient is insured through [facility] .   Per test claim: PA required; PA submitted to above mentioned insurance via CoverMyMeds Key/confirmation #/EOC [id number] Status is pending

## 2023-07-12 NOTE — Telephone Encounter (Signed)
 This pt last saw you 10-2022.  Ordered ajovy  and nurtec, although your note does say either nurtec or ubrelvy for acute.  She never did get medication:  this is her message:  Never mind, I see why it hasn't been filled. The nurtec is almost $1000. And the ajovy  isn't covered. I wasn't having an issue but now that weather has warmed up I'm getting migraines again :( I think we need to look at other options.  She also had insurance change in the mean time so did Pa's and ajovy  approved but not nurtec (want her to try [place]).  Ok to try ubrelvy or would you want her seen prior.

## 2023-07-12 NOTE — Telephone Encounter (Signed)
 I notified pt in mychart. About denial. She has appt 07-20-2023 with MM/NP (mychart VV).

## 2023-07-12 NOTE — Telephone Encounter (Signed)
 Pharmacy Patient Advocate Encounter  Received notification from [facility] that Prior Authorization for Nurtec 75MG  dispersible tablets has been DENIED.  Full denial letter will be uploaded to the media tab. See denial reason below.   PA #/Case ID/Reference #: PA Case ID #: [id number]  PT MUST TRY UBRELVY FIRST.

## 2023-07-17 ENCOUNTER — Telehealth: Payer: Managed Care, Other (non HMO) | Admitting: Adult Health

## 2023-07-18 ENCOUNTER — Telehealth: Payer: Self-pay | Admitting: Neurology

## 2023-07-18 NOTE — Telephone Encounter (Signed)
 Request to cx, not needed

## 2023-07-20 ENCOUNTER — Telehealth: Admitting: Adult Health

## 2023-07-23 MED ORDER — AJOVY 225 MG/1.5ML ~~LOC~~ SOAJ: 225.0000 mg | SUBCUTANEOUS | 3 refills | Status: AC

## 2023-08-10 ENCOUNTER — Telehealth: Payer: Self-pay

## 2023-08-10 ENCOUNTER — Other Ambulatory Visit (HOSPITAL_COMMUNITY): Payer: Self-pay

## 2023-08-10 NOTE — Telephone Encounter (Signed)
 Pharmacy Patient Advocate Encounter   Received notification from CoverMyMeds that prior authorization for Nurtec 75 is required/requested.   Insurance verification completed.   The patient is insured through [facility] .   Per test claim: patient has a plan benefit exclusion. This medication is non formulary, therefore it will not be covered.

## 2023-08-13 ENCOUNTER — Telehealth: Payer: Self-pay | Admitting: Neurology

## 2023-08-13 NOTE — Telephone Encounter (Signed)
LVM and sent mychart msg asking pt to call back to schedule follow up

## 2023-08-13 NOTE — Telephone Encounter (Signed)
 Can you give patent a followup please? In 4-6 months with an NP first avail. Can be video. thanks!

## 2023-12-23 ENCOUNTER — Other Ambulatory Visit: Payer: Self-pay | Admitting: Family Medicine

## 2024-01-20 ENCOUNTER — Other Ambulatory Visit: Payer: Self-pay | Admitting: Family Medicine

## 2024-01-21 ENCOUNTER — Encounter: Payer: Self-pay | Admitting: *Deleted

## 2024-02-17 ENCOUNTER — Other Ambulatory Visit: Payer: Self-pay | Admitting: Family Medicine

## 2024-02-19 ENCOUNTER — Encounter: Payer: Self-pay | Admitting: Family Medicine

## 2024-02-19 DIAGNOSIS — G43709 Chronic migraine without aura, not intractable, without status migrainosus: Secondary | ICD-10-CM

## 2024-02-20 ENCOUNTER — Other Ambulatory Visit: Payer: Self-pay | Admitting: Family Medicine

## 2024-02-20 DIAGNOSIS — G43709 Chronic migraine without aura, not intractable, without status migrainosus: Secondary | ICD-10-CM

## 2024-02-20 MED ORDER — NURTEC 75 MG PO TBDP: 75.0000 mg | ORAL_TABLET | Freq: Every day | ORAL | 11 refills | Status: DC | PRN

## 2024-02-22 NOTE — Progress Notes (Deleted)
 [facility] [address], [address] [place], [place] [address] [phone]  Date:  02/25/2024   Name:  Janet Martinez   DOB:  17-Sep-1985   MRN:  [id number]  PCP:  Watt Harlene BROCKS, MD    Chief Complaint: No chief complaint on file.   History of Present Illness:  Janet Martinez is a 39 y.o. 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   Abnormal fetal ultrasound    Nonallopathic lesion-rib cage 12/02/2013   Nonallopathic lesion of cervical region 03/17/2013   Nonallopathic lesion of lumbosacral region 03/17/2013   Nonallopathic lesion of thoracic region 03/17/2013   SVD (spontaneous vaginal delivery) 02/08/2011    Past Medical History:  Diagnosis Date   Anxiety    Depression    History of chicken pox    Infection    UTI   Medical history non-contributory     Past Surgical History:  Procedure Laterality Date   NO PAST SURGERIES     WISDOM TOOTH EXTRACTION      Social History[1]  Family History  Problem Relation Age of Onset   Miscarriages / Stillbirths Mother    Migraines Mother    Mental illness Sister        anxiety   Seizures Brother    Cancer Paternal Aunt        multiple types   Cancer Paternal Grandmother        ovarian   Breast cancer Other     Allergies[2]  Medication list has been reviewed and updated.  Medications Ordered Prior to Encounter[3]  Review of Systems:  As per HPI- otherwise  negative.   Physical Examination: There were no vitals filed for this visit. There were no vitals filed for this visit. There is no height or weight on file to calculate BMI. Ideal Body Weight:    GEN: no acute distress. HEENT: Atraumatic, Normocephalic.  Ears and Nose: No external deformity. CV: RRR, No M/G/R. No JVD. No thrill. No extra heart sounds. PULM: CTA B, no wheezes, crackles, rhonchi. No retractions. No resp. distress. No accessory muscle use. ABD: S, NT, ND, +BS. No rebound. No HSM. EXTR: No c/c/e PSYCH: Normally interactive. Conversant.    Assessment and Plan: No diagnosis found.  Assessment & Plan   Signed Harlene Watt, MD    [1]  Social History Tobacco Use   Smoking status: Never   Smokeless tobacco: Never  Vaping Use   Vaping status: Never Used  Substance Use Topics   Alcohol use: Yes    Alcohol/week: 14.0 standard drinks of alcohol    Types: 14 Glasses of wine per week   Drug use: No  [2] No Known Allergies [3]  Current Outpatient Medications on File Prior to Visit  Medication Sig Dispense Refill   Ascorbic Acid (VITAMIN C PO) Take by mouth.     B Complex Vitamins (B COMPLEX PO) Take by  mouth.     Fremanezumab -vfrm (AJOVY ) 225 MG/1.5ML SOAJ Inject 225 mg into the skin every 30 (thirty) days. 1.5 mL 3   MAGNESIUM PO Take by mouth.     Rimegepant Sulfate (NURTEC) 75 MG TBDP Take 1 tablet (75 mg total) by mouth daily as needed. For migraines. Take as close to onset of migraine as possible. One daily maximum. 16 tablet 11   sertraline  (ZOLOFT ) 100 MG tablet Take 2 tablets (200 mg total) by mouth daily. Needs appt 60 tablet 0   No current facility-administered medications on file prior to visit.   "

## 2024-02-25 ENCOUNTER — Ambulatory Visit: Admitting: Family Medicine

## 2024-03-09 NOTE — Progress Notes (Unsigned)
 [facility] [address], [address] [place], [place] [address] [phone]  Date:  03/13/2024   Name:  Janet Martinez   DOB:  12/12/1985   MRN:  [id number]  PCP:  Watt Harlene BROCKS, MD    Chief Complaint: No chief complaint on file.   History of Present Illness:  Janet Martinez is a 39 y.o. very pleasant female patient who presents with the following:  Patient seen today with concern of migraine headaches.  I saw her most recently in September 2024 She has history of depression and anxiety, migraine headache I recently refilled her Nurtec but did ask her to come in for follow-up since it has been a while Can update routine labs today ?  Any family history of breast or colon cancer  Discussed the use of AI scribe software for clinical note transcription with the patient, who gave verbal consent to proceed.  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   Abnormal fetal ultrasound    Nonallopathic lesion-rib cage 12/02/2013   Nonallopathic lesion of cervical region 03/17/2013   Nonallopathic lesion of lumbosacral region 03/17/2013   Nonallopathic lesion of thoracic region 03/17/2013   SVD (spontaneous vaginal delivery) 02/08/2011    Past Medical History:  Diagnosis Date   Anxiety    Depression    History of chicken pox    Infection    UTI   Medical history non-contributory     Past Surgical History:  Procedure Laterality Date   NO PAST SURGERIES     WISDOM TOOTH EXTRACTION      Social History[1]  Family History  Problem Relation Age of Onset   Miscarriages / Stillbirths Mother    Migraines Mother    Mental illness Sister        anxiety   Seizures Brother     Cancer Paternal Aunt        multiple types   Cancer Paternal Grandmother        ovarian   Breast cancer Other     Allergies[2]  Medication list has been reviewed and updated.  Medications Ordered Prior to Encounter[3]  Review of Systems:  As per HPI- otherwise negative.   Physical Examination: There were no vitals filed for this visit. There were no vitals filed for this visit. There is no height or weight on file to calculate BMI. Ideal Body Weight:    GEN: no acute distress. HEENT: Atraumatic, Normocephalic.  Ears and Nose: No external deformity. CV: RRR, No M/G/R. No JVD. No thrill. No extra heart sounds. PULM: CTA B, no wheezes, crackles, rhonchi. No retractions. No resp. distress. No accessory muscle use. ABD: S, NT, ND, +BS. No rebound. No HSM. EXTR: No c/c/e PSYCH: Normally interactive. Conversant.    Assessment and Plan: No diagnosis found.  Assessment & Plan   Signed Harlene Watt, MD    [1]  Social History Tobacco Use   Smoking status: Never   Smokeless tobacco: Never  Vaping Use   Vaping status: Never Used  Substance Use Topics   Alcohol use: Yes    Alcohol/week: 14.0 standard drinks of alcohol    Types: 14 Glasses of wine per  week   Drug use: No  [2] No Known Allergies [3]  Current Outpatient Medications on File Prior to Visit  Medication Sig Dispense Refill   Ascorbic Acid (VITAMIN C PO) Take by mouth.     B Complex Vitamins (B COMPLEX PO) Take by mouth.     Fremanezumab -vfrm (AJOVY ) 225 MG/1.5ML SOAJ Inject 225 mg into the skin every 30 (thirty) days. 1.5 mL 3   MAGNESIUM PO Take by mouth.     Rimegepant Sulfate (NURTEC) 75 MG TBDP Take 1 tablet (75 mg total) by mouth daily as needed. For migraines. Take as close to onset of migraine as possible. One daily maximum. 16 tablet 11   sertraline  (ZOLOFT ) 100 MG tablet Take 2 tablets (200 mg total) by mouth daily. Needs appt 60 tablet 0   No current facility-administered medications on file  prior to visit.   "

## 2024-03-13 ENCOUNTER — Ambulatory Visit (INDEPENDENT_AMBULATORY_CARE_PROVIDER_SITE_OTHER): Admitting: Family Medicine

## 2024-03-13 ENCOUNTER — Encounter: Payer: Self-pay | Admitting: Family Medicine

## 2024-03-13 VITALS — BP 122/80 | HR 75 | Temp 98.2°F | Ht 62.0 in | Wt 132.8 lb

## 2024-03-13 DIAGNOSIS — Z130 Encounter for screening for diseases of the blood and blood-forming organs and certain disorders involving the immune mechanism: Secondary | ICD-10-CM

## 2024-03-13 DIAGNOSIS — G43109 Migraine with aura, not intractable, without status migrainosus: Secondary | ICD-10-CM | POA: Diagnosis not present

## 2024-03-13 DIAGNOSIS — Z1329 Encounter for screening for other suspected endocrine disorder: Secondary | ICD-10-CM | POA: Diagnosis not present

## 2024-03-13 DIAGNOSIS — Z0000 Encounter for general adult medical examination without abnormal findings: Secondary | ICD-10-CM

## 2024-03-13 DIAGNOSIS — Z13220 Encounter for screening for lipoid disorders: Secondary | ICD-10-CM

## 2024-03-13 DIAGNOSIS — G4482 Headache associated with sexual activity: Secondary | ICD-10-CM

## 2024-03-13 DIAGNOSIS — Z131 Encounter for screening for diabetes mellitus: Secondary | ICD-10-CM | POA: Diagnosis not present

## 2024-03-13 MED ORDER — UBRELVY 100 MG PO TABS: ORAL_TABLET | ORAL | 2 refills | Status: AC

## 2024-03-13 NOTE — Patient Instructions (Signed)
 I will be touch with your labs and will set up you for imaging to rule out anything else causing these headaches

## 2024-03-14 ENCOUNTER — Encounter: Payer: Self-pay | Admitting: Family Medicine

## 2024-03-14 DIAGNOSIS — E039 Hypothyroidism, unspecified: Secondary | ICD-10-CM

## 2024-03-14 LAB — CBC
HCT: 43 % (ref 36.0–46.0)
Hemoglobin: 14.5 g/dL (ref 12.0–15.0)
MCHC: 33.9 g/dL (ref 30.0–36.0)
MCV: 94.3 fl (ref 78.0–100.0)
Platelets: 208 K/uL (ref 150.0–400.0)
RBC: 4.56 Mil/uL (ref 3.87–5.11)
RDW: 12.6 % (ref 11.5–15.5)
WBC: 6.9 K/uL (ref 4.0–10.5)

## 2024-03-14 LAB — TSH: TSH: 6.65 u[IU]/mL — ABNORMAL HIGH (ref 0.35–5.50)

## 2024-03-14 LAB — LIPID PANEL
Cholesterol: 214 mg/dL — ABNORMAL HIGH (ref 28–200)
HDL: 79.3 mg/dL
LDL Cholesterol: 113 mg/dL — ABNORMAL HIGH (ref 10–99)
NonHDL: 134.25
Total CHOL/HDL Ratio: 3
Triglycerides: 107 mg/dL (ref 10.0–149.0)
VLDL: 21.4 mg/dL (ref 0.0–40.0)

## 2024-03-14 LAB — COMPREHENSIVE METABOLIC PANEL WITH GFR
ALT: 18 U/L (ref 3–35)
AST: 22 U/L (ref 5–37)
Albumin: 4.9 g/dL (ref 3.5–5.2)
Alkaline Phosphatase: 60 U/L (ref 39–117)
BUN: 22 mg/dL (ref 6–23)
CO2: 29 meq/L (ref 19–32)
Calcium: 9.8 mg/dL (ref 8.4–10.5)
Chloride: 99 meq/L (ref 96–112)
Creatinine, Ser: 0.76 mg/dL (ref 0.40–1.20)
GFR: 98.7 mL/min
Glucose, Bld: 96 mg/dL (ref 70–99)
Potassium: 4 meq/L (ref 3.5–5.1)
Sodium: 136 meq/L (ref 135–145)
Total Bilirubin: 0.4 mg/dL (ref 0.2–1.2)
Total Protein: 7.7 g/dL (ref 6.0–8.3)

## 2024-03-14 LAB — HEMOGLOBIN A1C: Hgb A1c MFr Bld: 5.3 % (ref 4.6–6.5)

## 2024-03-14 NOTE — Addendum Note (Signed)
 Addended by: WATT RAISIN C on: 03/14/2024 09:14 AM   Modules accepted: Orders

## 2024-03-14 NOTE — Addendum Note (Signed)
 Addended by: WATT RAISIN C on: 03/14/2024 08:56 AM   Modules accepted: Orders

## 2024-03-15 ENCOUNTER — Telehealth: Payer: Self-pay | Admitting: Family Medicine

## 2024-03-15 ENCOUNTER — Encounter: Payer: Self-pay | Admitting: Family Medicine

## 2024-03-15 NOTE — Telephone Encounter (Signed)
 Stat CT head and CT angiogram from yesterday have not been read.  I have made multiple phone calls to [facility] in an attempt to find someone who was able to get her scans in the read queue.  Through the PAL line I was able to connect with Franky who I believe was a radiologist although I did not get his last name.  He stated that stat scans from outpatient imaging are not necessarily read the same day and was not able to commit to reading her CT scans today.  At this point I have exhausted all options at my disposal to get this patient CT scan read and reached out to her to explain the situation

## 2024-03-17 ENCOUNTER — Telehealth: Payer: Self-pay

## 2024-03-17 ENCOUNTER — Other Ambulatory Visit (HOSPITAL_COMMUNITY): Payer: Self-pay

## 2024-03-17 ENCOUNTER — Encounter: Payer: Self-pay | Admitting: Family Medicine

## 2024-03-17 MED ORDER — LEVOTHYROXINE SODIUM 25 MCG PO TABS: 25.0000 ug | ORAL_TABLET | Freq: Every day | ORAL | 3 refills | Status: AC

## 2024-03-17 NOTE — Telephone Encounter (Signed)
 Pharmacy Patient Advocate Encounter   Received notification from Physician's Office that prior authorization for Ubrelvy  is required/requested.   Insurance verification completed.   The patient is insured through [facility].   Per test claim: PA required; PA submitted to above mentioned insurance via Latent Key/confirmation #/EOC [facility] Status is pending

## 2024-03-18 ENCOUNTER — Other Ambulatory Visit (HOSPITAL_COMMUNITY): Payer: Self-pay

## 2024-03-18 NOTE — Telephone Encounter (Signed)
 Pharmacy Patient Advocate Encounter  Received notification from [facility] that Prior Authorization for Ubrelvy  has been APPROVED from 03/18/2024 to 03/18/2025. Ran test claim, Copay is $0. This test claim was processed through [facility]- copay amounts may vary at other pharmacies due to pharmacy/plan contracts, or as the patient moves through the different stages of their insurance plan.   PA #/Case ID/Reference #: [id number]  Can only fill 16 in a 30 day period

## 2024-03-18 NOTE — Telephone Encounter (Signed)
 Pharmacy Patient Advocate Encounter  Received notification from [facility] that Prior Authorization for Ubrelvy  has been DENIED.  Denied due to quantity.  Resubmitted for 15tabs for a 30 DS    PA #/Case ID/Reference #: [id number]

## 2024-03-25 ENCOUNTER — Encounter: Payer: Self-pay | Admitting: Family Medicine
